# Patient Record
Sex: Male | Born: 1960 | Race: White | Hispanic: No
Health system: Southern US, Community
[De-identification: ages and names within clinical notes are randomized; demographics above are authoritative.]

---

## 2013-07-16 ENCOUNTER — Inpatient Hospital Stay (HOSPITAL_COMMUNITY): Payer: Medicaid Other

## 2013-07-16 ENCOUNTER — Inpatient Hospital Stay (HOSPITAL_COMMUNITY)
Admission: EM | Admit: 2013-07-16 | Discharge: 2013-07-29 | DRG: 296 | Disposition: E | Payer: Medicaid Other | Attending: Internal Medicine | Admitting: Internal Medicine

## 2013-07-16 ENCOUNTER — Emergency Department (HOSPITAL_COMMUNITY): Payer: Medicaid Other

## 2013-07-16 DIAGNOSIS — D649 Anemia, unspecified: Secondary | ICD-10-CM | POA: Diagnosis present

## 2013-07-16 DIAGNOSIS — R799 Abnormal finding of blood chemistry, unspecified: Secondary | ICD-10-CM

## 2013-07-16 DIAGNOSIS — Z681 Body mass index (BMI) 19 or less, adult: Secondary | ICD-10-CM

## 2013-07-16 DIAGNOSIS — J690 Pneumonitis due to inhalation of food and vomit: Secondary | ICD-10-CM | POA: Diagnosis not present

## 2013-07-16 DIAGNOSIS — E43 Unspecified severe protein-calorie malnutrition: Secondary | ICD-10-CM

## 2013-07-16 DIAGNOSIS — J4489 Other specified chronic obstructive pulmonary disease: Secondary | ICD-10-CM | POA: Diagnosis present

## 2013-07-16 DIAGNOSIS — R579 Shock, unspecified: Secondary | ICD-10-CM | POA: Diagnosis present

## 2013-07-16 DIAGNOSIS — I469 Cardiac arrest, cause unspecified: Secondary | ICD-10-CM

## 2013-07-16 DIAGNOSIS — R4182 Altered mental status, unspecified: Secondary | ICD-10-CM

## 2013-07-16 DIAGNOSIS — Z66 Do not resuscitate: Secondary | ICD-10-CM | POA: Diagnosis not present

## 2013-07-16 DIAGNOSIS — C159 Malignant neoplasm of esophagus, unspecified: Secondary | ICD-10-CM | POA: Diagnosis present

## 2013-07-16 DIAGNOSIS — R569 Unspecified convulsions: Secondary | ICD-10-CM | POA: Diagnosis not present

## 2013-07-16 DIAGNOSIS — J9600 Acute respiratory failure, unspecified whether with hypoxia or hypercapnia: Secondary | ICD-10-CM | POA: Diagnosis present

## 2013-07-16 DIAGNOSIS — R778 Other specified abnormalities of plasma proteins: Secondary | ICD-10-CM

## 2013-07-16 DIAGNOSIS — G931 Anoxic brain damage, not elsewhere classified: Secondary | ICD-10-CM | POA: Diagnosis present

## 2013-07-16 DIAGNOSIS — J9819 Other pulmonary collapse: Secondary | ICD-10-CM | POA: Diagnosis not present

## 2013-07-16 DIAGNOSIS — F17200 Nicotine dependence, unspecified, uncomplicated: Secondary | ICD-10-CM | POA: Diagnosis present

## 2013-07-16 DIAGNOSIS — E872 Acidosis, unspecified: Secondary | ICD-10-CM

## 2013-07-16 DIAGNOSIS — J449 Chronic obstructive pulmonary disease, unspecified: Secondary | ICD-10-CM | POA: Diagnosis present

## 2013-07-16 DIAGNOSIS — Z931 Gastrostomy status: Secondary | ICD-10-CM

## 2013-07-16 DIAGNOSIS — R7989 Other specified abnormal findings of blood chemistry: Secondary | ICD-10-CM

## 2013-07-16 DIAGNOSIS — Z515 Encounter for palliative care: Secondary | ICD-10-CM

## 2013-07-16 DIAGNOSIS — E876 Hypokalemia: Secondary | ICD-10-CM | POA: Diagnosis present

## 2013-07-16 LAB — POCT I-STAT, CHEM 8
BUN: 12 mg/dL (ref 6–23)
BUN: 14 mg/dL (ref 6–23)
BUN: 15 mg/dL (ref 6–23)
BUN: 17 mg/dL (ref 6–23)
BUN: 18 mg/dL (ref 6–23)
BUN: 18 mg/dL (ref 6–23)
CALCIUM ION: 1.26 mmol/L — AB (ref 1.12–1.23)
CHLORIDE: 105 meq/L (ref 96–112)
CHLORIDE: 106 meq/L (ref 96–112)
CHLORIDE: 107 meq/L (ref 96–112)
CHLORIDE: 113 meq/L — AB (ref 96–112)
CREATININE: 0.6 mg/dL (ref 0.50–1.35)
CREATININE: 0.6 mg/dL (ref 0.50–1.35)
Calcium, Ion: 1.2 mmol/L (ref 1.12–1.23)
Calcium, Ion: 1.23 mmol/L (ref 1.12–1.23)
Calcium, Ion: 1.26 mmol/L — ABNORMAL HIGH (ref 1.12–1.23)
Calcium, Ion: 1.26 mmol/L — ABNORMAL HIGH (ref 1.12–1.23)
Calcium, Ion: 1.26 mmol/L — ABNORMAL HIGH (ref 1.12–1.23)
Chloride: 105 mEq/L (ref 96–112)
Chloride: 105 mEq/L (ref 96–112)
Creatinine, Ser: 0.5 mg/dL (ref 0.50–1.35)
Creatinine, Ser: 0.6 mg/dL (ref 0.50–1.35)
Creatinine, Ser: 0.7 mg/dL (ref 0.50–1.35)
Creatinine, Ser: 0.7 mg/dL (ref 0.50–1.35)
GLUCOSE: 118 mg/dL — AB (ref 70–99)
Glucose, Bld: 270 mg/dL — ABNORMAL HIGH (ref 70–99)
Glucose, Bld: 278 mg/dL — ABNORMAL HIGH (ref 70–99)
Glucose, Bld: 259 mg/dL — ABNORMAL HIGH (ref 70–99)
Glucose, Bld: 212 mg/dL — ABNORMAL HIGH (ref 70–99)
Glucose, Bld: 126 mg/dL — ABNORMAL HIGH (ref 70–99)
HCT: 37 % — ABNORMAL LOW (ref 39.0–52.0)
HCT: 35 % — ABNORMAL LOW (ref 39.0–52.0)
HCT: 36 % — ABNORMAL LOW (ref 39.0–52.0)
HEMATOCRIT: 37 % — AB (ref 39.0–52.0)
HEMATOCRIT: 38 % — AB (ref 39.0–52.0)
HEMATOCRIT: 38 % — AB (ref 39.0–52.0)
HEMOGLOBIN: 12.6 g/dL — AB (ref 13.0–17.0)
HEMOGLOBIN: 12.9 g/dL — AB (ref 13.0–17.0)
Hemoglobin: 11.9 g/dL — ABNORMAL LOW (ref 13.0–17.0)
Hemoglobin: 12.2 g/dL — ABNORMAL LOW (ref 13.0–17.0)
Hemoglobin: 12.6 g/dL — ABNORMAL LOW (ref 13.0–17.0)
Hemoglobin: 12.9 g/dL — ABNORMAL LOW (ref 13.0–17.0)
POTASSIUM: 3.7 meq/L (ref 3.7–5.3)
POTASSIUM: 3.3 meq/L — AB (ref 3.7–5.3)
POTASSIUM: 3.6 meq/L — AB (ref 3.7–5.3)
Potassium: 2.9 mEq/L — CL (ref 3.7–5.3)
Potassium: 3 mEq/L — ABNORMAL LOW (ref 3.7–5.3)
Potassium: 3.5 mEq/L — ABNORMAL LOW (ref 3.7–5.3)
SODIUM: 144 meq/L (ref 137–147)
SODIUM: 143 meq/L (ref 137–147)
SODIUM: 144 meq/L (ref 137–147)
SODIUM: 141 meq/L (ref 137–147)
SODIUM: 148 meq/L — AB (ref 137–147)
Sodium: 147 mEq/L (ref 137–147)
TCO2: 24 mmol/L (ref 0–100)
TCO2: 24 mmol/L (ref 0–100)
TCO2: 25 mmol/L (ref 0–100)
TCO2: 27 mmol/L (ref 0–100)
TCO2: 27 mmol/L (ref 0–100)
TCO2: 27 mmol/L (ref 0–100)

## 2013-07-16 LAB — CBC
HEMATOCRIT: 33.9 % — AB (ref 39.0–52.0)
Hemoglobin: 11.2 g/dL — ABNORMAL LOW (ref 13.0–17.0)
MCH: 31.3 pg (ref 26.0–34.0)
MCHC: 33 g/dL (ref 30.0–36.0)
MCV: 94.7 fL (ref 78.0–100.0)
Platelets: 479 10*3/uL — ABNORMAL HIGH (ref 150–400)
RBC: 3.58 MIL/uL — ABNORMAL LOW (ref 4.22–5.81)
RDW: 14.2 % (ref 11.5–15.5)
WBC: 38.9 10*3/uL — ABNORMAL HIGH (ref 4.0–10.5)

## 2013-07-16 LAB — POCT I-STAT 3, ART BLOOD GAS (G3+)
Acid-Base Excess: 3 mmol/L — ABNORMAL HIGH (ref 0.0–2.0)
Bicarbonate: 26.3 mEq/L — ABNORMAL HIGH (ref 20.0–24.0)
O2 Saturation: 100 %
PH ART: 7.48 — AB (ref 7.350–7.450)
PO2 ART: 261 mmHg — AB (ref 80.0–100.0)
TCO2: 27 mmol/L (ref 0–100)
pCO2 arterial: 35.4 mmHg (ref 35.0–45.0)

## 2013-07-16 LAB — COMPREHENSIVE METABOLIC PANEL
ALBUMIN: 2.3 g/dL — AB (ref 3.5–5.2)
ALK PHOS: 102 U/L (ref 39–117)
ALT: 60 U/L — ABNORMAL HIGH (ref 0–53)
ALT: 57 U/L — AB (ref 0–53)
AST: 58 U/L — ABNORMAL HIGH (ref 0–37)
AST: 63 U/L — AB (ref 0–37)
Albumin: 2.1 g/dL — ABNORMAL LOW (ref 3.5–5.2)
Alkaline Phosphatase: 82 U/L (ref 39–117)
BILIRUBIN TOTAL: 0.2 mg/dL — AB (ref 0.3–1.2)
BILIRUBIN TOTAL: 0.3 mg/dL (ref 0.3–1.2)
BUN: 19 mg/dL (ref 6–23)
BUN: 19 mg/dL (ref 6–23)
CHLORIDE: 99 meq/L (ref 96–112)
CHLORIDE: 107 meq/L (ref 96–112)
CO2: 23 mEq/L (ref 19–32)
CO2: 25 mEq/L (ref 19–32)
CREATININE: 0.69 mg/dL (ref 0.50–1.35)
Calcium: 9.6 mg/dL (ref 8.4–10.5)
Calcium: 8.6 mg/dL (ref 8.4–10.5)
Creatinine, Ser: 0.8 mg/dL (ref 0.50–1.35)
GFR calc Af Amer: >90 mL/min (ref >90)
GFR calc non Af Amer: >90 mL/min (ref >90)
GLUCOSE: 298 mg/dL — AB (ref 70–99)
Glucose, Bld: 247 mg/dL — ABNORMAL HIGH (ref 70–99)
POTASSIUM: 3 meq/L — AB (ref 3.7–5.3)
Potassium: 2.9 mEq/L — CL (ref 3.7–5.3)
Sodium: 141 mEq/L (ref 137–147)
Sodium: 143 mEq/L (ref 137–147)
Total Protein: 7.1 g/dL (ref 6.0–8.3)
Total Protein: 6.2 g/dL (ref 6.0–8.3)

## 2013-07-16 LAB — URINE MICROSCOPIC-ADD ON

## 2013-07-16 LAB — CBC WITH DIFFERENTIAL/PLATELET
Basophils Absolute: 0 10*3/uL (ref 0.0–0.1)
Basophils Relative: 0 % (ref 0–1)
EOS PCT: 1 % (ref 0–5)
Eosinophils Absolute: 0.4 10*3/uL (ref 0.0–0.7)
HEMATOCRIT: 35.6 % — AB (ref 39.0–52.0)
Hemoglobin: 11.8 g/dL — ABNORMAL LOW (ref 13.0–17.0)
LYMPHS ABS: 2.5 10*3/uL (ref 0.7–4.0)
Lymphocytes Relative: 7 % — ABNORMAL LOW (ref 12–46)
MCH: 32.1 pg (ref 26.0–34.0)
MCHC: 33.1 g/dL (ref 30.0–36.0)
MCV: 96.7 fL (ref 78.0–100.0)
MONOS PCT: 11 % (ref 3–12)
Monocytes Absolute: 4 10*3/uL — ABNORMAL HIGH (ref 0.1–1.0)
NEUTROS ABS: 29.3 10*3/uL — AB (ref 1.7–7.7)
Neutrophils Relative %: 81 % — ABNORMAL HIGH (ref 43–77)
Platelets: 640 10*3/uL — ABNORMAL HIGH (ref 150–400)
RBC: 3.68 MIL/uL — AB (ref 4.22–5.81)
RDW: 14.3 % (ref 11.5–15.5)
WBC: 36.2 10*3/uL — ABNORMAL HIGH (ref 4.0–10.5)

## 2013-07-16 LAB — URINALYSIS, ROUTINE W REFLEX MICROSCOPIC
Bilirubin Urine: NEGATIVE
Glucose, UA: NEGATIVE mg/dL
KETONES UR: NEGATIVE mg/dL
LEUKOCYTES UA: NEGATIVE
NITRITE: NEGATIVE
PH: 6.5 (ref 5.0–8.0)
Protein, ur: 30 mg/dL — AB
Specific Gravity, Urine: 1.019 (ref 1.005–1.030)
UROBILINOGEN UA: 0.2 mg/dL (ref 0.0–1.0)

## 2013-07-16 LAB — LACTIC ACID, PLASMA
Lactic Acid, Venous: 2.6 mmol/L — ABNORMAL HIGH (ref 0.5–2.2)
Lactic Acid, Venous: 1.5 mmol/L (ref 0.5–2.2)

## 2013-07-16 LAB — PROTIME-INR
INR: 1.26 (ref 0.00–1.49)
INR: 1.34 (ref 0.00–1.49)
PROTHROMBIN TIME: 15.5 s — AB (ref 11.6–15.2)
Prothrombin Time: 16.3 seconds — ABNORMAL HIGH (ref 11.6–15.2)

## 2013-07-16 LAB — BLOOD GAS, ARTERIAL
Acid-base deficit: 0 mmol/L (ref 0.0–2.0)
Bicarbonate: 24.9 mEq/L — ABNORMAL HIGH (ref 20.0–24.0)
Drawn by: 331761
FIO2: 0.4 %
LHR: 14 {breaths}/min
O2 Saturation: 98.1 %
PEEP/CPAP: 5 cmH2O
PO2 ART: 91.3 mmHg (ref 80.0–100.0)
Patient temperature: 91.4
TCO2: 26.4 mmol/L (ref 0–100)
VT: 500 mL
pCO2 arterial: 38.6 mmHg (ref 35.0–45.0)
pH, Arterial: 7.403 (ref 7.350–7.450)

## 2013-07-16 LAB — MRSA PCR SCREENING: MRSA BY PCR: NEGATIVE

## 2013-07-16 LAB — APTT
aPTT: 33 seconds (ref 24–37)
aPTT: 33 seconds (ref 24–37)

## 2013-07-16 LAB — GLUCOSE, CAPILLARY
GLUCOSE-CAPILLARY: 258 mg/dL — AB (ref 70–99)
GLUCOSE-CAPILLARY: 242 mg/dL — AB (ref 70–99)
GLUCOSE-CAPILLARY: 196 mg/dL — AB (ref 70–99)
Glucose-Capillary: 266 mg/dL — ABNORMAL HIGH (ref 70–99)
Glucose-Capillary: 214 mg/dL — ABNORMAL HIGH (ref 70–99)
Glucose-Capillary: 177 mg/dL — ABNORMAL HIGH (ref 70–99)

## 2013-07-16 LAB — STREP PNEUMONIAE URINARY ANTIGEN: STREP PNEUMO URINARY ANTIGEN: NEGATIVE

## 2013-07-16 LAB — TROPONIN I
TROPONIN I: 2.42 ng/mL — AB (ref <0.30)
Troponin I: 0.53 ng/mL (ref <0.30)
Troponin I: 2.6 ng/mL (ref <0.30)

## 2013-07-16 LAB — PROCALCITONIN: Procalcitonin: <0.1 ng/mL

## 2013-07-16 LAB — CORTISOL: Cortisol, Plasma: 34.1 ug/dL

## 2013-07-16 LAB — CG4 I-STAT (LACTIC ACID): LACTIC ACID, VENOUS: 7.36 mmol/L — AB (ref 0.5–2.2)

## 2013-07-16 LAB — MAGNESIUM: Magnesium: 2.3 mg/dL (ref 1.5–2.5)

## 2013-07-16 LAB — PHOSPHORUS: Phosphorus: 3 mg/dL (ref 2.3–4.6)

## 2013-07-16 MED ORDER — BIOTENE DRY MOUTH MT LIQD
15.0000 mL | Freq: Four times a day (QID) | OROMUCOSAL | Status: DC
  Administered 2013-07-17 – 2013-07-19 (×10): 15 mL via OROMUCOSAL

## 2013-07-16 MED ORDER — SODIUM CHLORIDE 0.9 % IV SOLN
INTRAVENOUS | Status: DC
  Administered 2013-07-16: 2 [IU]/h via INTRAVENOUS
  Filled 2013-07-16 (×2): qty 1

## 2013-07-16 MED ORDER — ASPIRIN 300 MG RE SUPP
300.0000 mg | RECTAL | Status: DC
  Administered 2013-07-16: 300 mg via RECTAL

## 2013-07-16 MED ORDER — FENTANYL CITRATE 0.05 MG/ML IJ SOLN: 100.0000 ug | INTRAMUSCULAR | Status: DC | PRN

## 2013-07-16 MED ORDER — MIDAZOLAM HCL 2 MG/2ML IJ SOLN: 2.0000 mg | Freq: Once | INTRAMUSCULAR | Status: DC

## 2013-07-16 MED ORDER — FENTANYL CITRATE 0.05 MG/ML IJ SOLN: 100.0000 ug | Freq: Once | INTRAMUSCULAR | Status: AC | PRN

## 2013-07-16 MED ORDER — CHLORHEXIDINE GLUCONATE 0.12 % MT SOLN
15.0000 mL | Freq: Two times a day (BID) | OROMUCOSAL | Status: DC
  Administered 2013-07-16 – 2013-07-19 (×6): 15 mL via OROMUCOSAL
  Filled 2013-07-16 (×6): qty 15

## 2013-07-16 MED ORDER — CISATRACURIUM BOLUS VIA INFUSION
0.1000 mg/kg | Freq: Once | INTRAVENOUS | Status: DC
  Filled 2013-07-16: qty 6

## 2013-07-16 MED ORDER — SODIUM CHLORIDE 0.9 % IV SOLN
25.0000 ug/h | INTRAVENOUS | Status: DC
  Administered 2013-07-16: 100 ug/h via INTRAVENOUS
  Filled 2013-07-16: qty 50

## 2013-07-16 MED ORDER — PANTOPRAZOLE SODIUM 40 MG IV SOLR
40.0000 mg | Freq: Every day | INTRAVENOUS | Status: DC
  Administered 2013-07-16 – 2013-07-18 (×3): 40 mg via INTRAVENOUS
  Filled 2013-07-16 (×5): qty 40

## 2013-07-16 MED ORDER — FENTANYL CITRATE 0.05 MG/ML IJ SOLN
100.0000 ug | Freq: Once | INTRAMUSCULAR | Status: AC
  Administered 2013-07-16: 100 ug via INTRAVENOUS

## 2013-07-16 MED ORDER — PIPERACILLIN-TAZOBACTAM 3.375 G IVPB
3.3750 g | Freq: Three times a day (TID) | INTRAVENOUS | Status: DC
  Filled 2013-07-16 (×2): qty 50

## 2013-07-16 MED ORDER — FENTANYL BOLUS VIA INFUSION
50.0000 ug | INTRAVENOUS | Status: DC | PRN
  Filled 2013-07-16: qty 50

## 2013-07-16 MED ORDER — MIDAZOLAM HCL 2 MG/2ML IJ SOLN: 2.0000 mg | Freq: Once | INTRAMUSCULAR | Status: AC | PRN

## 2013-07-16 MED ORDER — PIPERACILLIN-TAZOBACTAM 3.375 G IVPB 30 MIN: 3.3750 g | Freq: Once | INTRAVENOUS | Status: DC

## 2013-07-16 MED ORDER — VANCOMYCIN HCL IN DEXTROSE 750-5 MG/150ML-% IV SOLN
750.0000 mg | Freq: Two times a day (BID) | INTRAVENOUS | Status: DC
  Administered 2013-07-16 – 2013-07-19 (×6): 750 mg via INTRAVENOUS
  Filled 2013-07-16 (×8): qty 150

## 2013-07-16 MED ORDER — POTASSIUM CHLORIDE 10 MEQ/100ML IV SOLN
10.0000 meq | INTRAVENOUS | Status: AC
  Administered 2013-07-16 (×4): 10 meq via INTRAVENOUS
  Filled 2013-07-16 (×4): qty 100

## 2013-07-16 MED ORDER — SODIUM CHLORIDE 0.9 % IV SOLN
2000.0000 mL | Freq: Once | INTRAVENOUS | Status: AC
  Administered 2013-07-16: 2000 mL via INTRAVENOUS

## 2013-07-16 MED ORDER — SODIUM CHLORIDE 0.9 % IV SOLN
1.0000 ug/kg/min | INTRAVENOUS | Status: DC
  Administered 2013-07-16: 1 ug/kg/min via INTRAVENOUS
  Filled 2013-07-16: qty 20

## 2013-07-16 MED ORDER — PIPERACILLIN-TAZOBACTAM 3.375 G IVPB
3.3750 g | Freq: Three times a day (TID) | INTRAVENOUS | Status: DC
  Administered 2013-07-16 – 2013-07-19 (×9): 3.375 g via INTRAVENOUS
  Filled 2013-07-16 (×12): qty 50

## 2013-07-16 MED ORDER — SODIUM CHLORIDE 0.9 % IV BOLUS (SEPSIS)
1000.0000 mL | Freq: Once | INTRAVENOUS | Status: AC
  Administered 2013-07-16: 1000 mL via INTRAVENOUS

## 2013-07-16 MED ORDER — FENTANYL BOLUS VIA INFUSION
50.0000 ug | INTRAVENOUS | Status: DC | PRN
Start: 2013-07-16 — End: 2013-07-16
  Filled 2013-07-16: qty 50

## 2013-07-16 MED ORDER — DEXTROSE 10 % IV SOLN
INTRAVENOUS | Status: DC | PRN
Start: 2013-07-16 — End: 2013-07-16

## 2013-07-16 MED ORDER — ASPIRIN 300 MG RE SUPP
300.0000 mg | RECTAL | Status: AC
  Administered 2013-07-16: 300 mg via RECTAL
  Filled 2013-07-16: qty 1

## 2013-07-16 MED ORDER — MIDAZOLAM BOLUS VIA INFUSION
2.0000 mg | INTRAVENOUS | Status: DC | PRN
  Filled 2013-07-16: qty 2

## 2013-07-16 MED ORDER — SODIUM CHLORIDE 0.9 % IV SOLN
1.0000 mg/h | INTRAVENOUS | Status: DC
  Administered 2013-07-16 (×2): 2 mg/h via INTRAVENOUS
  Filled 2013-07-16 (×4): qty 10

## 2013-07-16 MED ORDER — DOPAMINE-DEXTROSE 3.2-5 MG/ML-% IV SOLN
INTRAVENOUS | Status: AC
  Administered 2013-07-16: 800 mg
  Filled 2013-07-16: qty 250

## 2013-07-16 MED ORDER — FENTANYL CITRATE 0.05 MG/ML IJ SOLN
INTRAMUSCULAR | Status: AC
  Filled 2013-07-16: qty 2

## 2013-07-16 MED ORDER — SODIUM CHLORIDE 0.9 % IV SOLN
25.0000 ug/h | INTRAVENOUS | Status: DC
  Filled 2013-07-16 (×3): qty 50

## 2013-07-16 MED ORDER — IPRATROPIUM-ALBUTEROL 0.5-2.5 (3) MG/3ML IN SOLN
3.0000 mL | RESPIRATORY_TRACT | Status: DC
  Administered 2013-07-16 – 2013-07-19 (×18): 3 mL via RESPIRATORY_TRACT
  Filled 2013-07-16 (×18): qty 3

## 2013-07-16 MED ORDER — SODIUM CHLORIDE 0.9 % IV SOLN: 2000.0000 mL | Freq: Once | INTRAVENOUS | Status: DC

## 2013-07-16 MED ORDER — DEXTROSE 5 % IV SOLN
0.5000 ug/min | INTRAVENOUS | Status: DC
  Administered 2013-07-16: 5.493 ug/min via INTRAVENOUS
  Administered 2013-07-17: 30 ug/min via INTRAVENOUS
  Administered 2013-07-17: 20 ug/min via INTRAVENOUS
  Administered 2013-07-17: 10 ug/min via INTRAVENOUS
  Administered 2013-07-17: 20 ug/min via INTRAVENOUS
  Administered 2013-07-18 (×2): 30 ug/min via INTRAVENOUS
  Administered 2013-07-18: 20 ug/min via INTRAVENOUS
  Filled 2013-07-16 (×8): qty 4

## 2013-07-16 MED ORDER — FENTANYL CITRATE 0.05 MG/ML IJ SOLN: 100.0000 ug | Freq: Once | INTRAMUSCULAR | Status: DC

## 2013-07-16 MED ORDER — ENOXAPARIN SODIUM 40 MG/0.4ML ~~LOC~~ SOLN
40.0000 mg | SUBCUTANEOUS | Status: DC
  Administered 2013-07-16 – 2013-07-19 (×4): 40 mg via SUBCUTANEOUS
  Filled 2013-07-16 (×5): qty 0.4

## 2013-07-16 MED ORDER — CISATRACURIUM BOLUS VIA INFUSION
0.0500 mg/kg | INTRAVENOUS | Status: DC | PRN
  Filled 2013-07-16: qty 3

## 2013-07-16 MED ORDER — NOREPINEPHRINE BITARTRATE 1 MG/ML IJ SOLN
0.5000 ug/min | INTRAVENOUS | Status: DC
  Administered 2013-07-16: 10 ug/min via INTRAVENOUS
  Filled 2013-07-16: qty 4

## 2013-07-16 MED ORDER — CISATRACURIUM BESYLATE 10 MG/ML IV SOLN
1.0000 ug/kg/min | INTRAVENOUS | Status: DC
  Filled 2013-07-16: qty 20

## 2013-07-16 MED ORDER — DEXTROSE 10 % IV SOLN: INTRAVENOUS | Status: DC | PRN

## 2013-07-16 MED ORDER — INSULIN GLARGINE 100 UNIT/ML ~~LOC~~ SOLN
10.0000 [IU] | Freq: Every day | SUBCUTANEOUS | Status: DC
  Administered 2013-07-16 – 2013-07-18 (×3): 10 [IU] via SUBCUTANEOUS
  Filled 2013-07-16 (×4): qty 0.1

## 2013-07-16 MED ORDER — PROPOFOL 10 MG/ML IV EMUL
5.0000 ug/kg/min | INTRAVENOUS | Status: DC
Start: 2013-07-16 — End: 2013-07-16
  Administered 2013-07-16: 10 ug/kg/min via INTRAVENOUS
  Filled 2013-07-16: qty 100

## 2013-07-16 MED ORDER — POTASSIUM CHLORIDE 10 MEQ/50ML IV SOLN
10.0000 meq | INTRAVENOUS | Status: AC
  Administered 2013-07-16 (×2): 10 meq via INTRAVENOUS
  Filled 2013-07-16 (×2): qty 50

## 2013-07-16 MED ORDER — VANCOMYCIN HCL IN DEXTROSE 750-5 MG/150ML-% IV SOLN
750.0000 mg | Freq: Two times a day (BID) | INTRAVENOUS | Status: DC
  Filled 2013-07-16 (×3): qty 150

## 2013-07-16 MED ORDER — INSULIN ASPART 100 UNIT/ML ~~LOC~~ SOLN: 2.0000 [IU] | SUBCUTANEOUS | Status: DC

## 2013-07-16 MED ORDER — IOHEXOL 300 MG/ML  SOLN
75.0000 mL | Freq: Once | INTRAMUSCULAR | Status: AC | PRN
  Administered 2013-07-16: 75 mL via INTRAVENOUS

## 2013-07-16 NOTE — Progress Notes (Addendum)
Pt with unknown nutrition related history admitted s/p PEA.  Sister reported poor PO at least in the past 3 days.  Pt meets criteria for severe malnutrition based on physical exam showing severe muscle and subcutaneous fat wasting.  Pt with h/o esophageal cancer and G-tube in place. Unsure whether pt was using tube at home.  Recent hospitalization for further cancer work-up. Will page MD for refeeding labs if TFs are initiated for pt after re-warming process.  RD notes pt with poor prognosis.   Brynda Greathouse, MS RD LDN Clinical Inpatient Dietitian Pager: [phone] Weekend/After hours pager: [phone]

## 2013-07-16 NOTE — ED Notes (Signed)
Pt arrived from home via [place], c/o post CPR.

## 2013-07-16 NOTE — Progress Notes (Signed)
INITIAL NUTRITION ASSESSMENT  DOCUMENTATION CODES Per approved criteria  -Severe malnutrition in the context of chronic illness   INTERVENTION:  If TF initiated, recommend Vital AF 1.2 at 20 ml/hr increasing by 10 ml/hr every 12 hours to reach goal rate of 50 ml/hr.  Above TF regimen provides 1440 kcal (99% of estimated needs), 90 grams protein, and 973 ml free water.  Patient at risk for refeeding syndrome due to severe malnutrition. Recommend monitoring magnesium, potassium, and phosphorous for 3 days once feeds are started.  NUTRITION DIAGNOSIS: Inadequate oral intake related to inability to eat as evidenced by NPO status.   Goal: Patient to meet >/=90% of estimated nutrition needs  Monitor:  Vent status, TF initiation, weight trends, lab trends  Reason for Assessment: New Ventilator  52 y.o. male  Admitting Dx: PEA arrest  ASSESSMENT: 53 yo WM with little known PMH , presents with post arrests, down time 15 minutes, CPR x 5 minutes , 1 epi with return of pulse. Transported via [facility] to [facility] ED and king airway changed to OTT. 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  Per EMS RN and Dr Roxanne Mins info obtained later who got infrom sister in law: Discharged from [place] on 07/13/13 following throat cancer workup. Post dc not doing well and not eating and having dyspnea. Family found him in bathroom. Asystole on EMS arrival. And epi x 1 with ROSC. Then enroute PEA with epi x 2 and arrived on dopamine.   Patient is currently intubated on ventilator support. MV:  7.3 L/min Temp (24hrs), Avg:92.3 F (33.5 C), Min:90.7 F (32.6 C), Max:95.5 F (35.3 C)   Nutrition Focused Physical Exam:  Subcutaneous Fat:  Orbital Region: moderate depletion Upper Arm Region: severe depletion Thoracic and Lumbar Region: moderate depletion  Muscle:  Temple Region: moderate depletion Clavicle Bone Region: moderate depletion Clavicle and Acromion Bone Region: moderate  depletion Scapular Bone Region: moderate depletion Dorsal Hand: moderate depletion Patellar Region: severe depletion Anterior Thigh Region: N/A Posterior Calf Region: severe depletion  Edema: none noted   Patient meets criteria for severe malnutrition in the context of chronic illness as evidenced by severe body fat and muscle mass depletion.  Height: Ht Readings from Last 1 Encounters:  08-14-13 5\' 6"  (1.676 m)    Weight: Wt Readings from Last 1 Encounters:  08/14/2013 114 lb 13.8 oz (52.1 kg)    Ideal Body Weight: 142 lb (64.5 kg)  % Ideal Body Weight: 80%  Wt Readings from Last 10 Encounters:  Aug 14, 2013 114 lb 13.8 oz (52.1 kg)    Usual Body Weight: unkown  % Usual Body Weight: unable to assess  BMI:  Body mass index is 18.55 kg/(m^2).  Estimated Nutritional Needs: Kcal: 1448 Protein: 80-90 grams Fluid: >1.5 L  Skin: no wounds  Diet Order:  NPO  EDUCATION NEEDS: -Education not appropriate at this time   Intake/Output Summary (Last 24 hours) at 08-14-13 1249 Last data filed at August 14, 2013 1200  Gross per 24 hour  Intake 1965.98 ml  Output    845 ml  Net 1120.98 ml    Last BM: PTA  Labs:   Recent Labs Lab August 14, 2013 0600 08/14/2013 0741 08/14/13 0859 08/14/2013 1105  NA 141 143 144 143  K 3.0* 2.9* 2.9* 3.7  CL 99 107 105 105  CO2 23 25  --   --   BUN 19 19 18 18   CREATININE 0.80 0.69 0.70 0.70  CALCIUM 9.6 8.6  --   --  MG  --  2.3  --   --   PHOS  --  3.0  --   --   GLUCOSE 298* 247* 270* 278*    CBG (last 3)  No results found for this basename: GLUCAP,  in the last 72 hours  Scheduled Meds: . sodium chloride  2,000 mL Intravenous Once  . cisatracurium  0.1 mg/kg Intravenous Once  . enoxaparin (LOVENOX) injection  40 mg Subcutaneous Q24H  . fentaNYL      . fentaNYL  100 mcg Intravenous Once  . insulin aspart  2-6 Units Subcutaneous 6 times per day  . ipratropium-albuterol  3 mL Nebulization Q4H  . midazolam  2 mg Intravenous Once  .  pantoprazole (PROTONIX) IV  40 mg Intravenous QHS  . piperacillin-tazobactam (ZOSYN)  IV  3.375 g Intravenous Q8H  . vancomycin  750 mg Intravenous Q12H    Continuous Infusions: . cisatracurium (NIMBEX) infusion 1.5 mcg/kg/min (07-21-2013 1200)  . fentaNYL infusion INTRAVENOUS 150 mcg/hr (Jul 21, 2013 1200)  . insulin (NOVOLIN-R) infusion    . midazolam (VERSED) infusion 3 mg/hr (July 21, 2013 1200)  . norepinephrine (LEVOPHED) Adult infusion 10 mcg/min (July 21, 2013 1200)    No past medical history on file.  No past surgical history on file.  Claudell Kyle, Dietetic Intern Pager: [phone]

## 2013-07-16 NOTE — ED Notes (Signed)
EMS reports initial rhythm on scene as asystole

## 2013-07-16 NOTE — Procedures (Signed)
Central Venous Catheter Insertion Procedure Note Brandon Aguirre [id number] 1961-03-29  Procedure: Insertion of Central Venous Catheter Indications: Assessment of intravascular volume  Procedure Details Consent: Risks of procedure as well as the alternatives and risks of each were explained to the (patient/caregiver).  Consent for procedure obtained. and Unable to obtain consent because of altered level of consciousness. Time Out: Verified patient identification, verified procedure, site/side was marked, verified correct patient position, special equipment/implants available, medications/allergies/relevent history reviewed, required imaging and test results available.  Performed  Maximum sterile technique was used including antiseptics, cap, gloves, gown, hand hygiene, mask and sheet. Skin prep: Chlorhexidine; local anesthetic administered A antimicrobial bonded/coated triple lumen catheter was placed in the left internal jugular vein using the Seldinger technique. Ultrasound guidance used.yes Catheter placed to 20 cm. Blood aspirated via all 3 ports and then flushed x 3. Line sutured x 2 and dressing applied.  Evaluation Blood flow good Complications: No apparent complications Patient did tolerate procedure well. Chest X-ray ordered to verify placement.  CXR: normal.  Brandon Aguirre Brandon Aguirre ACNP Brandon Aguirre PCCM Pager [phone] till 3 pm If no answer page [phone] 08-10-2013, 8:43 AM

## 2013-07-16 NOTE — Progress Notes (Signed)
Utilization Review Completed.Brandon Aguirre T2/16/2015  

## 2013-07-16 NOTE — Procedures (Signed)
ELECTROENCEPHALOGRAM REPORT   Patient: Brandon Aguirre       Room #: 6V78 EEG No. ID: [id number] Age: 53 y.o.        Sex: male Referring Physician: Chase Caller Report Date:  Jul 31, 2013        Interpreting Physician: Alexis Goodell D  History: Redmond Whittley is an 53 y.o. male unresponsive s/p arrest  Medications:  Scheduled: . sodium chloride  2,000 mL Intravenous Once  . cisatracurium  0.1 mg/kg Intravenous Once  . enoxaparin (LOVENOX) injection  40 mg Subcutaneous Q24H  . fentaNYL      . fentaNYL  100 mcg Intravenous Once  . insulin aspart  2-6 Units Subcutaneous 6 times per day  . ipratropium-albuterol  3 mL Nebulization Q4H  . midazolam  2 mg Intravenous Once  . pantoprazole (PROTONIX) IV  40 mg Intravenous QHS  . piperacillin-tazobactam (ZOSYN)  IV  3.375 g Intravenous Q8H  . vancomycin  750 mg Intravenous Q12H    Conditions of Recording:  This is a 16 channel EEG carried out with the patient in the intubated and sedated state on hypothermic protocol.  Description:  The background activity is attenuated for the majority of the tracing with no cerebral activity appreciated despite decreasing the sensitivity to 5uV/mm.  Infrequently there was noted bursts of high voltage polyspike, spike and slow wave activity lasting up to 5 seconds per burst.  These were in clusters when they did occur with intervening attenuated activity.  During two of these clusters some facial twitching was noted but not for the extent of the cluster.    Hyperventilation and intermittent photic stimulation were not performed.  IMPRESSION: This is a markedly abnormal electroencephalogram secondary to the markedly attenuated background activity and bursts of polyspike, spike and slow wave activity.  This can be seen during hypothermia and at this time there is no suggestion of nonconvulsive status epilepticus.     Alexis Goodell, MD [facility] [phone] 07/31/2013, 1:14 PM

## 2013-07-16 NOTE — ED Notes (Signed)
I stat lactic acid results given to Dr. Roxanne Mins by B. Yolanda Bonine, EMT

## 2013-07-16 NOTE — Progress Notes (Addendum)
Both phone numbers in chart have either been changed or disconnected. CSW attempting to find additional contact information. Phone numbers for Brandon Aguirre (sister-in-law): home [phone]) cell [phone]). Pt states home number is best number to reach her.   Ky Barban, MSW, [facility] Clinical Social Worker [phone]

## 2013-07-16 NOTE — Procedures (Addendum)
Supervised procedure.  Real time 2D ultrasound used for vein site selection, patency assessment, and needle entry.  A record of image was made but could not be submitted for filing due to malfunction of printing device   Dr. Brand Males, M.D., [facility].C.P Pulmonary and Critical Care Medicine Staff Physician [place] Pager: [phone], If no answer or between  15:00h - 7:00h: call [phone]  07/18/2013 9:43 AM

## 2013-07-16 NOTE — Progress Notes (Addendum)
Notified Dr. Melvyn Novas of k level.

## 2013-07-16 NOTE — Progress Notes (Signed)
Name: Brandon Aguirre MRN: [id number] DOB: 26-Aug-1960  ELECTRONIC ICU PHYSICIAN NOTE  Problem:  hypokalemia  Intervention:  KCl x 4 runs and check abg's for alkalosis  Christinia Gully 07/11/2013, 7:06 PM

## 2013-07-16 NOTE — H&P (Addendum)
Name: Brandon Aguirre MRN: [id number] DOB: 1960/07/03    ADMISSION DATE:  07/06/2013   REFERRING MD :  EDP PRIMARY SERVICE:PCCM  CHIEF COMPLAINT: PEA arrest  BRIEF PATIENT DESCRIPTION:   53 yo WM with little known PMH , presents with post arrests, down time 15 minutes, CPR x 5 minutes , 1 epi with return of pulse. Transported via [facility] to [facility] ED and king airway changed to OTT. 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 No other information or family available.  STaff MD note: Per EMS RN and Dr Roxanne Mins info obtained later who got infrom sister in law: Discharged from [place] on 07/13/13 following throat cancer workup. Post dc not doing well and not eating and having dyspnea. Family found him in bathroom. Asystole on EMS arrival. And epi x 1 with ROSC. Then enroute PEA with epi x 2 and arrived on dopamine  SIGNIFICANT EVENTS / STUDIES:  2/16 hypothermia>>  LINES / TUBES: 2/16 ott>> 2/16 Lt I J cvl>>  CULTURES: 2/16 bc x 2>> 2/16 uc>> 2/16 sputum>>  ANTIBIOTICS: 2/16 vanc>> 2/16 zoysn>>  HISTORY OF PRESENT ILLNESS:    53 yo WM with little known PMH , presents with post arrests, down time 15 minutes, CPR x 5 minutes , 1 epi with return of pulse. Transported via [facility] to [facility] ED and king airway changed to OTT. 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 No other information or family available.  PAST MEDICAL HISTORY :  No past medical history on file. No past surgical history on file. Prior to Admission medications   Not on File   Allergies not on file  FAMILY HISTORY:  No family history on file. SOCIAL HISTORY:  has no tobacco, alcohol, and drug history on file.  REVIEW OF SYSTEMS: Na  SUBJECTIVE:   VITAL SIGNS: Temp:  [95.2 F (35.1 C)-95.5 F (35.3 C)] 95.2 F (35.1 C) (02/16 0630) Pulse Rate:  [83-125] 125 (02/16 0640) Resp:  [11-25] 17 (02/16 0640) BP: (77-129)/(44-60) 112/60 mmHg (02/16 0640) SpO2:  [99 %-100 %] 100 %  (02/16 0640) FiO2 (%):  [100 %] 100 % (02/16 0649) Weight:  [115 lb (52.164 kg)] 115 lb (52.164 kg) (02/16 0601) HEMODYNAMICS:   VENTILATOR SETTINGS: Vent Mode:  [-] PRVC FiO2 (%):  [100 %] 100 % Set Rate:  [14 bmp] 14 bmp Vt Set:  [400 mL-500 mL] 500 mL PEEP:  [5 cmH20] 5 cmH20 Plateau Pressure:  [11 cmH20] 11 cmH20 INTAKE / OUTPUT: Intake/Output   None     PHYSICAL EXAMINATION: General:  Thin wm on vent with recent nmb Neuro: sedated on vent, ?? Seizure movement on eyelids and face HEENT: no jvd/lan Cardiovascular:  HSR RRR Lungs:  CTA, air hungry or current vent settings Abdomen:  Flat, G tube in place Musculoskeletal:  Intact Skin:  warm  LABS: PULMONARY  Recent Labs Lab 07/15/2013 0641  PHART 7.480*  PCO2ART 35.4  PO2ART 261.0*  HCO3 26.3*  TCO2 27  O2SAT 100.0    CBC  Recent Labs Lab 07/25/2013 0600  HGB 11.8*  HCT 35.6*  WBC 36.2*  PLT 640*    COAGULATION  Recent Labs Lab 07/18/2013 0600  INR 1.26    CARDIAC   Recent Labs Lab 07/11/2013 0600  TROPONINI 0.53*   No results found for this basename: PROBNP,  in the last 168 hours   CHEMISTRY  Recent Labs Lab 07/18/2013 0600  NA 141  K 3.0*  CL 99  CO2 23  GLUCOSE 298*  BUN 19  CREATININE 0.80  CALCIUM 9.6   Estimated Creatinine Clearance: 77.9 ml/min (by C-G formula based on Cr of 0.8).   LIVER  Recent Labs Lab 07/22/2013 0600  AST 58*  ALT 60*  ALKPHOS 102  BILITOT 0.2*  PROT 7.1  ALBUMIN 2.3*  INR 1.26     INFECTIOUS  Recent Labs Lab 07/12/2013 0604  LATICACIDVEN 7.36*     ENDOCRINE CBG (last 3)  No results found for this basename: GLUCAP,  in the last 72 hours       IMAGING x48h  Dg Chest Portable 1 View  07/25/2013   CLINICAL DATA:  Central line placement  EXAM: PORTABLE CHEST - 1 VIEW  COMPARISON:  07/06/2013 at 6:04 a.m.  FINDINGS: Endotracheal tube ends in the mid thoracic trachea. New left IJ central venous catheter, tip at the level of the mid  SVC.  Normal heart size and mediastinal contours. Hyperinflated lungs without new opacification. The retrocardiac lung is not well evaluated due to overlapping external defibrillator pads. No effusion or pneumothorax.  IMPRESSION: New left IJ catheter in good position.  No pneumothorax.   Electronically Signed   By: Jorje Guild M.D.   On: 07/23/2013 07:02   Dg Chest Portable 1 View  07/12/2013   CLINICAL DATA:  Check ETT position.  EXAM: PORTABLE CHEST - 1 VIEW  COMPARISON:  06/29/2013  FINDINGS: Endotracheal tube is 4 cm above the carina. Heart is normal size. Patchy airspace disease in the left lung base. Right lung is clear. No effusions or acute bony abnormality.  IMPRESSION: Endotracheal tube 4 cm above the carina.  Patchy left lower lobe atelectasis or infiltrate.   Electronically Signed   By: Rolm Baptise M.D.   On: 07/13/2013 06:35        ASSESSMENT / PLAN:  PULMONARY A: VDRF secondary to Arrest.  ? Hx of COPD P:   Vent Bundle duopneb  CARDIOVASCULAR A: Asytole P:  Hypothermia protocol Serial troponin Get ECHO Hold off cards consult for now  RENAL A:  Hypokalemia P:   Replete and follow labs Track mag and phos  GASTROINTESTINAL A:  G tube in place. Hx  Of Esophogeal cancer  Per scantry reports. 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 seizure P:   Check ct head EEG  ONCOLOGY A: Esophageal cancer. CT neck 06/29/13 with cancer in throat  P CT neck Get oncology notes and prognosis   GLOBAL A: Poor prognosis. No family at bedside since patient arrival to ER . Per Dr Roxanne Mins of ER who  spoke to sister in law: patient is smoker, etoh and returned from [facility] in cab. Therefore, suspect poor social support  P Social work consult Spiritual care consult  [name] Minor ACNP Maryanna Shape PCCM Pager [phone] till 3 pm If no answer page [phone] 07/28/2013, 6:57 AM    STAFF MD NOTE:   53 yo WM with little known PMH , presents with post arrests, down time 15 minutes,  CPR x 5 minutes , 1 epi with return of pulse. Transported via [facility] to [facility] ED and king airway changed to OTT. 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 No other information or family available. In ER: seein with possible seizure on face and eyelids by staff MD. Will try to get EEG. Also, access [facility] records    STAFF NOTE: I, Dr Ann Lions have personally reviewed patient's available data, including medical history, events of note, physical examination and test results as part of my evaluation. I have discussed with resident/NP and other care providers such as pharmacist, RN and RRT.  In addition,  I personally evaluated patient and elicited key findings of  See above staff note  Rest per NP/medical resident whose note is outlined above and that I agree with  The patient is critically ill with multiple organ systems failure and requires high complexity decision making for assessment and support, frequent evaluation and titration of therapies, application of advanced monitoring technologies and extensive interpretation of multiple databases.   Critical Care Time devoted to patient care services described in this note is  45  Minutes.  Dr. Brand Males, M.D., [facility].C.P Pulmonary and Critical Care Medicine Staff Physician [place] [facility] Critical [facility] Pager: [phone], If no answer or between  15:00h - 7:00h: call [phone]  07/28/2013 7:30 AM

## 2013-07-16 NOTE — Progress Notes (Signed)
ANTIBIOTIC CONSULT NOTE - INITIAL  Pharmacy Consult for Vancocin and Zosyn Indication: rule out sepsis  Patient Measurements: Height: 5\' 6"  (167.6 cm) Weight: 115 lb (52.164 kg) IBW/kg (Calculated) : 63.8  Vital Signs: Temp: 95.2 F (35.1 C) (02/16 0630) BP: 112/60 mmHg (02/16 0640) Pulse Rate: 125 (02/16 0640)  Labs:  Recent Labs  July 25, 2013 0600  WBC 36.2*  HGB 11.8*  PLT 640*  CREATININE 0.80   Estimated Creatinine Clearance: 77.9 ml/min (by C-G formula based on Cr of 0.8).   Microbiology: No results found for this or any previous visit (from the past 720 hour(s)).  Medical History: No past medical history on file.   Assessment: 53yo male presents via [facility] s/p CPR, initial rhythm per EMS was asystole, ROSC s/p CPR x8min w/ epi x2 and dopamine gtt, started on hypothermia, concern for PNA/sepsis w/ atelectasis vs infiltrate on CXR, to begin IV ABX.  Goal of Therapy:  Vancomycin trough level 15-20 mcg/ml  Plan:  Will begin vancomycin 750mg  IV Q12H and Zosyn 3.375g IV Q8H and monitor CBC, Cx, levels prn.  Wynona Neat, PharmD, BCPS  07-25-13,7:20 AM

## 2013-07-16 NOTE — ED Notes (Addendum)
EMS started Dopamine drip at 5 mcg. 400 mg per 250 ML

## 2013-07-16 NOTE — ED Provider Notes (Signed)
CSN: [id number]     Arrival date & time 07/17/2013  0547 History   First MD Initiated Contact with Patient 07/11/2013 [phone]     Chief Complaint  Patient presents with  . Post CPR      (Consider location/radiation/quality/duration/timing/severity/associated sxs/prior Treatment) The history is provided by the EMS personnel. The history is limited by the condition of the patient (Unresponsive).   53 year old male was brought to the ED status post cardiac arrest with successful resuscitation. Family is not available at this time. EMS reports that they were called to the home because of difficulty breathing and on arrival, patient was noted to be apneic and pulseless with CPR having been initiated. Initial rhythm was asystole but there is return of spontaneous circulation with a single dose of epinephrine. In route to the hospital, they placed lost pulses so at which point the rhythm was pulseless electrical activity which also responded only to occasional single dose of epinephrine. He was started on dopamine for hypotension. There is a report that he is being evaluated for a cancer of the neck and also some problem with his sinuses. EMS did place a [facility] airway.  No past medical history on file. No past surgical history on file. No family history on file. History  Substance Use Topics  . Smoking status: Not on file  . Smokeless tobacco: Not on file  . Alcohol Use: Not on file    Review of Systems  Unable to perform ROS: Patient unresponsive      Allergies  Review of patient's allergies indicates not on file.  Home Medications  No current outpatient prescriptions on file. BP 112/60  Pulse 125  Temp(Src) 95.2 F (35.1 C)  Resp 17  Ht 5\' 6"  (1.676 m)  Wt 115 lb (52.164 kg)  BMI 18.57 kg/m2  SpO2 100% Physical Exam  Nursing note and vitals reviewed.  53 year old male who is unresponsive. Vital signs are significant for tachycardia with heart rate of 125. Oxygen saturation is 100%,  which is normal. Head is normocephalic and atraumatic. Pupils are 4 mm and nonreactive. Oropharynx is clear. King airway is in place Neck is supple without adenopathy or JVD. Lungs sounds are decreased on the left without rales, wheezes, or rhonchi. Chest movement is symmetric. Heart has regular rate and rhythm without murmur. Abdomen is soft, flat,with feeding tube in place. There are no masses or hepatosplenomegaly. Extremities have no cyanosis or edema. Skin is warm and dry without rash. Neurologic: GCS=3.  ED Course  Procedures (including critical care time) INTUBATION Performed by: [facility]  Required items: required blood products, implants, devices, and special equipment available Patient identity confirmed: provided demographic data and hospital-assigned identification number Time out: Immediately prior to procedure a "time out" was called to verify the correct patient, procedure, equipment, support staff and site/side marked as required.  Indications: Cardiac arrest   Intubation method: Direct Laryngoscopy   Preoxygenation: BVT [facility] Airway)  Sedatives: none Paralytic: none  Tube Size: 7.5 cuffed  Occasional spontaneous respirations were noted during intubation. There is no direct visual evidence of his head and neck cancer.   Post-procedure assessment: chest rise and ETCO2 monitor Breath sounds: equal and absent over the epigastrium Tube secured with: ETT holder Chest x-ray interpreted by radiologist and me.  Chest x-ray findings: endotracheal tube in appropriate position  Patient tolerated the procedure well with no immediate complications.    Labs Review Results for orders placed during the hospital encounter of 07/18/2013  CBC WITH  DIFFERENTIAL      Result Value Ref Range   WBC 36.2 (*) 4.0 - 10.5 K/uL   RBC 3.68 (*) 4.22 - 5.81 MIL/uL   Hemoglobin 11.8 (*) 13.0 - 17.0 g/dL   HCT 35.6 (*) 39.0 - 52.0 %   MCV 96.7  78.0 - 100.0 fL   MCH 32.1  26.0 -  34.0 pg   MCHC 33.1  30.0 - 36.0 g/dL   RDW 14.3  11.5 - 15.5 %   Platelets 640 (*) 150 - 400 K/uL   Neutrophils Relative % 81 (*) 43 - 77 %   Lymphocytes Relative 7 (*) 12 - 46 %   Monocytes Relative 11  3 - 12 %   Eosinophils Relative 1  0 - 5 %   Basophils Relative 0  0 - 1 %   Neutro Abs 29.3 (*) 1.7 - 7.7 K/uL   Lymphs Abs 2.5  0.7 - 4.0 K/uL   Monocytes Absolute 4.0 (*) 0.1 - 1.0 K/uL   Eosinophils Absolute 0.4  0.0 - 0.7 K/uL   Basophils Absolute 0.0  0.0 - 0.1 K/uL   WBC Morphology ATYPICAL LYMPHOCYTES     Smear Review PLATELET CLUMPS NOTED ON SMEAR    COMPREHENSIVE METABOLIC PANEL      Result Value Ref Range   Sodium 141  137 - 147 mEq/L   Potassium 3.0 (*) 3.7 - 5.3 mEq/L   Chloride 99  96 - 112 mEq/L   CO2 23  19 - 32 mEq/L   Glucose, Bld 298 (*) 70 - 99 mg/dL   BUN 19  6 - 23 mg/dL   Creatinine, Ser 0.80  0.50 - 1.35 mg/dL   Calcium 9.6  8.4 - 10.5 mg/dL   Total Protein 7.1  6.0 - 8.3 g/dL   Albumin 2.3 (*) 3.5 - 5.2 g/dL   AST 58 (*) 0 - 37 U/L   ALT 60 (*) 0 - 53 U/L   Alkaline Phosphatase 102  39 - 117 U/L   Total Bilirubin 0.2 (*) 0.3 - 1.2 mg/dL   GFR calc non Af Amer >90  >90 mL/min   GFR calc Af Amer >90  >90 mL/min  PROTIME-INR      Result Value Ref Range   Prothrombin Time 15.5 (*) 11.6 - 15.2 seconds   INR 1.26  0.00 - 1.49  APTT      Result Value Ref Range   aPTT 33  24 - 37 seconds  TROPONIN I      Result Value Ref Range   Troponin I 0.53 (*) <0.30 ng/mL  CG4 I-STAT (LACTIC ACID)      Result Value Ref Range   Lactic Acid, Venous 7.36 (*) 0.5 - 2.2 mmol/L  POCT I-STAT 3, BLOOD GAS (G3+)      Result Value Ref Range   pH, Arterial 7.480 (*) 7.350 - 7.450   pCO2 arterial 35.4  35.0 - 45.0 mmHg   pO2, Arterial 261.0 (*) 80.0 - 100.0 mmHg   Bicarbonate 26.3 (*) 20.0 - 24.0 mEq/L   TCO2 27  0 - 100 mmol/L   O2 Saturation 100.0     Acid-Base Excess 3.0 (*) 0.0 - 2.0 mmol/L   Patient temperature 98.6 F     Collection site RADIAL, ALLEN'S TEST  ACCEPTABLE     Drawn by Operator     Sample type ARTERIAL     Imaging Review Dg Chest Portable 1 View  30-Jul-2013   CLINICAL DATA:  Central line placement  EXAM: PORTABLE CHEST - 1 VIEW  COMPARISON:  07/23/2013 at 6:04 a.m.  FINDINGS: Endotracheal tube ends in the mid thoracic trachea. New left IJ central venous catheter, tip at the level of the mid SVC.  Normal heart size and mediastinal contours. Hyperinflated lungs without new opacification. The retrocardiac lung is not well evaluated due to overlapping external defibrillator pads. No effusion or pneumothorax.  IMPRESSION: New left IJ catheter in good position.  No pneumothorax.   Electronically Signed   By: Jorje Guild M.D.   On: 07/28/2013 07:02   Dg Chest Portable 1 View  07/12/2013   CLINICAL DATA:  Check ETT position.  EXAM: PORTABLE CHEST - 1 VIEW  COMPARISON:  06/29/2013  FINDINGS: Endotracheal tube is 4 cm above the carina. Heart is normal size. Patchy airspace disease in the left lung base. Right lung is clear. No effusions or acute bony abnormality.  IMPRESSION: Endotracheal tube 4 cm above the carina.  Patchy left lower lobe atelectasis or infiltrate.   Electronically Signed   By: Rolm Baptise M.D.   On: 07/23/2013 06:35   Images viewed by me.   Date: 07/18/2013  Rate: 86  Rhythm: normal sinus rhythm and premature atrial contractions (PAC)  QRS Axis: normal  Intervals: QT prolonged  ST/T Wave abnormalities: nonspecific ST changes  Conduction Disutrbances:none  Narrative Interpretation:  Occasional premature atrial contraction, borderline prolonged QT interval. No prior ECG available for comparison.  Old EKG Reviewed: none available  CRITICAL CARE Performed by: [id number] Total critical care time: 80 minutes Critical care time was exclusive of separately billable procedures and treating other patients. Critical care was necessary to treat or prevent imminent or life-threatening deterioration. Critical care was time spent  personally by me on the following activities: development of treatment plan with patient and/or surrogate as well as nursing, discussions with consultants, evaluation of patient's response to treatment, examination of patient, obtaining history from patient or surrogate, ordering and performing treatments and interventions, ordering and review of laboratory studies, ordering and review of radiographic studies, pulse oximetry and re-evaluation of patient's condition.   MDM   Final diagnoses:  Cardiac arrest  Elevated lactic acid level  Elevated troponin  Anemia  Hypokalemia    Successful resuscitation from out of hospital cardiac arrest. He has been hemodynamically stable in the ED although is maintained on pressors. Case is discussed with critical care medicine and decision was made to proceed with hypothermia protocol. ECG did not show STEMI and so emergent cardiology consultation is not needed. Troponin has come back elevated indicating that he does have a non-STEMI. No family has arrived with patient. Critical care medicine has been consulted to admit the patient.  After above that occurred, family member has called back. This was a sister-in-law who states that he would had been admitted to [facility] for his cancer evaluation and had been discharged 3 days ago. He apparently had been in generally declining health before getting much worse this morning prior to the ambulance call. She is unable to give any other history other than that he has a smoking history and a drinking history.    Delora Fuel, [place] [phone]

## 2013-07-16 NOTE — Progress Notes (Addendum)
Low b/p Dr Melvyn Novas notified. Orders given.

## 2013-07-16 NOTE — Progress Notes (Signed)
Name: Brandon Aguirre MRN: [id number] DOB: 06-23-1960  ELECTRONIC ICU PHYSICIAN NOTE  Problem:  Shock, currently on levophed 10    Intake/Output Summary (Last 24 hours) at 07/03/2013 1546 Last data filed at 07/02/2013 1519  Gross per 24 hour  Intake 2349.95 ml  Output   1184 ml  Net 1165.95 ml     CVP:  [3 mmHg] 3 mmHg    Intervention:  Vol expand x 1 liter per low cvp/low uop  Christinia Gully 07/18/2013, 3:45 PM

## 2013-07-16 NOTE — Progress Notes (Signed)
Stat EEG completed at bedside.  Dr. Doy Mince aware.

## 2013-07-16 NOTE — ED Notes (Addendum)
Per EMS CPR x 20 minutes. EMS administer epi x 2 and Dopamine at 5 mcg/kg

## 2013-07-16 NOTE — Procedures (Signed)
Arterial Catheter Insertion Procedure Note Brandon Aguirre [id number] 1960/12/28  Procedure: Insertion of Arterial Catheter  Indications: Blood pressure monitoring  Procedure Details Consent: Unable to obtain consent because of altered level of consciousness. Time Out: Verified patient identification, verified procedure, site/side was marked, verified correct patient position, special equipment/implants available, medications/allergies/relevent history reviewed, required imaging and test results available.  Performed  Maximum sterile technique was used including cap, gloves, gown, hand hygiene and mask. Skin prep: Chlorhexidine; local anesthetic administered 22 gauge catheter was attempted twice on left & twice on right per protocol using the Seldinger technique.  Evaluation Blood flow good; BP tracing unable to obtain . Complications: No apparent complications.  Arterial line placement was unsucessful. Attempted per protocol. Dr Brandon Aguirre notified that procedure was unsucessful.   Brandon Aguirre 07/18/2013

## 2013-07-16 NOTE — Progress Notes (Addendum)
Clinical Social Work Department BRIEF PSYCHOSOCIAL ASSESSMENT 07/25/2013  Patient:  Brandon Aguirre, Brandon Aguirre     Account Number:  [id number]     Admit date:  07/04/2013  Clinical Social Worker:  Freeman Caldron  Date/Time:  07/01/2013 11:07 AM  Referred by:  Physician  Date Referred:  07/12/2013 Referred for  Finding medical decision maker/next-of-kin   Other Referral:   Interview type:  MD Other interview type:    PSYCHOSOCIAL DATA Living Status:  ALONE Admitted from facility:   Level of care:   Primary support name:  Brandon Aguirre [phone]) Primary support relationship to patient:  FAMILY Degree of support available:   Fair--Pt has support from sister-in-law, who checked on pt via telephone call to ED doctor. Primary care office states pt living alone and gets some support from sister-in-law but pt lives alone and support is difficult to assess.    CURRENT CONCERNS Current Concerns  Post-Acute Placement   Other Concerns:    SOCIAL WORK ASSESSMENT / PLAN CSW received consult from MD to complete advanced directives. 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 MD asked CSW to find next-of-kin or HCPOA for decision making. CSW called phone numbers originally listed on chart, and both were out of order/disconnected. CSW called pt's primary care office and got phone numbers for a Caryl Bis (office unsure of relation but state pt listed him as an emergency contact in 2009) and pt's home number [phone]). CSW called home number and Brandon Aguirre answered the phone. Brandon Aguirre states she is pt's sister-in-law, and that she spoke with an MD this morning and realizes pt is in the hospital. CSW explained attending is trying to reach her to discuss disposition, and provided nursing station phone number and pt's RN phone number to pt to follow up with any questions. CSW put phone numbers on pt's electronic chart  and informed MD. Brandon Aguirre expressed concern for pt, and CSW provided support. MD will follow up.   Assessment/plan status:  No Further Intervention Required Other assessment/ plan:   Information/referral to community resources:   Identify next-of-kin for decision making    PATIENT'S/FAMILY'S RESPONSE TO PLAN OF CARE: Good--pt's sister-in-law expressed concern about pt and that she has spoken with an MD this morning. CSW got pt's contact information and assured her MD will give her a call and that they were interested in meeting with her tomorrow morning. Brandon Aguirre states this will depend on the weather, and CSW explained CSW will inform MD of contact numbers to reach her.       Ky Barban, MSW, [facility] Clinical Social Worker [phone]

## 2013-07-16 NOTE — Progress Notes (Signed)
Echocardiogram 2D Echocardiogram limited has been performed.  Brandon Aguirre 07/26/2013, 3:06 PM

## 2013-07-17 DIAGNOSIS — E43 Unspecified severe protein-calorie malnutrition: Secondary | ICD-10-CM | POA: Insufficient documentation

## 2013-07-17 LAB — BLOOD GAS, ARTERIAL
ACID-BASE DEFICIT: 0.4 mmol/L (ref 0.0–2.0)
Bicarbonate: 23.9 mEq/L (ref 20.0–24.0)
Drawn by: 331761
FIO2: 0.4 %
MECHVT: 500 mL
O2 Saturation: 97.2 %
PATIENT TEMPERATURE: 91.4
PEEP: 5 cmH2O
RATE: 14 resp/min
TCO2: 25.2 mmol/L (ref 0–100)
pCO2 arterial: 33.7 mmHg — ABNORMAL LOW (ref 35.0–45.0)
pH, Arterial: 7.444 (ref 7.350–7.450)
pO2, Arterial: 73 mmHg — ABNORMAL LOW (ref 80.0–100.0)

## 2013-07-17 LAB — LEGIONELLA ANTIGEN, URINE: Legionella Antigen, Urine: NEGATIVE

## 2013-07-17 LAB — GLUCOSE, CAPILLARY
GLUCOSE-CAPILLARY: 100 mg/dL — AB (ref 70–99)
GLUCOSE-CAPILLARY: 121 mg/dL — AB (ref 70–99)
GLUCOSE-CAPILLARY: 128 mg/dL — AB (ref 70–99)
GLUCOSE-CAPILLARY: 131 mg/dL — AB (ref 70–99)
GLUCOSE-CAPILLARY: 167 mg/dL — AB (ref 70–99)
Glucose-Capillary: 140 mg/dL — ABNORMAL HIGH (ref 70–99)
Glucose-Capillary: 134 mg/dL — ABNORMAL HIGH (ref 70–99)
Glucose-Capillary: 102 mg/dL — ABNORMAL HIGH (ref 70–99)
Glucose-Capillary: 118 mg/dL — ABNORMAL HIGH (ref 70–99)
Glucose-Capillary: 107 mg/dL — ABNORMAL HIGH (ref 70–99)
Glucose-Capillary: 155 mg/dL — ABNORMAL HIGH (ref 70–99)
Glucose-Capillary: 105 mg/dL — ABNORMAL HIGH (ref 70–99)

## 2013-07-17 LAB — POCT I-STAT, CHEM 8
BUN: 12 mg/dL (ref 6–23)
BUN: 11 mg/dL (ref 6–23)
BUN: 11 mg/dL (ref 6–23)
CHLORIDE: 105 meq/L (ref 96–112)
CHLORIDE: 106 meq/L (ref 96–112)
CREATININE: 0.5 mg/dL (ref 0.50–1.35)
CREATININE: 0.5 mg/dL (ref 0.50–1.35)
Calcium, Ion: 1.23 mmol/L (ref 1.12–1.23)
Calcium, Ion: 1.19 mmol/L (ref 1.12–1.23)
Calcium, Ion: 1.16 mmol/L (ref 1.12–1.23)
Chloride: 106 mEq/L (ref 96–112)
Creatinine, Ser: 0.7 mg/dL (ref 0.50–1.35)
GLUCOSE: 157 mg/dL — AB (ref 70–99)
Glucose, Bld: 172 mg/dL — ABNORMAL HIGH (ref 70–99)
Glucose, Bld: 127 mg/dL — ABNORMAL HIGH (ref 70–99)
HCT: 33 % — ABNORMAL LOW (ref 39.0–52.0)
HCT: 34 % — ABNORMAL LOW (ref 39.0–52.0)
HCT: 33 % — ABNORMAL LOW (ref 39.0–52.0)
HEMOGLOBIN: 11.6 g/dL — AB (ref 13.0–17.0)
Hemoglobin: 11.2 g/dL — ABNORMAL LOW (ref 13.0–17.0)
Hemoglobin: 11.2 g/dL — ABNORMAL LOW (ref 13.0–17.0)
POTASSIUM: 3.4 meq/L — AB (ref 3.7–5.3)
POTASSIUM: 3.8 meq/L (ref 3.7–5.3)
Potassium: 3.8 mEq/L (ref 3.7–5.3)
SODIUM: 145 meq/L (ref 137–147)
SODIUM: 145 meq/L (ref 137–147)
Sodium: 145 mEq/L (ref 137–147)
TCO2: 23 mmol/L (ref 0–100)
TCO2: 23 mmol/L (ref 0–100)
TCO2: 23 mmol/L (ref 0–100)

## 2013-07-17 LAB — BASIC METABOLIC PANEL
BUN: 12 mg/dL (ref 6–23)
CHLORIDE: 109 meq/L (ref 96–112)
CO2: 20 mEq/L (ref 19–32)
Calcium: 7.8 mg/dL — ABNORMAL LOW (ref 8.4–10.5)
Creatinine, Ser: 0.49 mg/dL — ABNORMAL LOW (ref 0.50–1.35)
GFR calc non Af Amer: >90 mL/min (ref >90)
Glucose, Bld: 140 mg/dL — ABNORMAL HIGH (ref 70–99)
POTASSIUM: 4.3 meq/L (ref 3.7–5.3)
SODIUM: 143 meq/L (ref 137–147)

## 2013-07-17 LAB — URINE CULTURE
CULTURE: NO GROWTH
Colony Count: NO GROWTH

## 2013-07-17 LAB — CBC
HEMATOCRIT: 32 % — AB (ref 39.0–52.0)
Hemoglobin: 10.7 g/dL — ABNORMAL LOW (ref 13.0–17.0)
MCH: 31.4 pg (ref 26.0–34.0)
MCHC: 33.4 g/dL (ref 30.0–36.0)
MCV: 93.8 fL (ref 78.0–100.0)
PLATELETS: 459 10*3/uL — AB (ref 150–400)
RBC: 3.41 MIL/uL — ABNORMAL LOW (ref 4.22–5.81)
RDW: 14.6 % (ref 11.5–15.5)
WBC: 36.1 10*3/uL — AB (ref 4.0–10.5)

## 2013-07-17 LAB — TROPONIN I: Troponin I: 2.39 ng/mL (ref <0.30)

## 2013-07-17 LAB — MAGNESIUM: Magnesium: 2.1 mg/dL (ref 1.5–2.5)

## 2013-07-17 LAB — PROCALCITONIN: Procalcitonin: 1.9 ng/mL

## 2013-07-17 LAB — PHOSPHORUS: PHOSPHORUS: 2.4 mg/dL (ref 2.3–4.6)

## 2013-07-17 MED ORDER — POTASSIUM CHLORIDE 10 MEQ/50ML IV SOLN
10.0000 meq | INTRAVENOUS | Status: AC
  Administered 2013-07-17 (×5): 10 meq via INTRAVENOUS
  Filled 2013-07-17 (×6): qty 50

## 2013-07-17 MED ORDER — ARTIFICIAL TEARS OP OINT
TOPICAL_OINTMENT | Freq: Three times a day (TID) | OPHTHALMIC | Status: DC
  Administered 2013-07-17 – 2013-07-18 (×4): via OPHTHALMIC
  Filled 2013-07-17: qty 3.5

## 2013-07-17 MED ORDER — SODIUM CHLORIDE 0.9 % IV SOLN
1.0000 mg/h | INTRAVENOUS | Status: DC
  Administered 2013-07-17 – 2013-07-18 (×2): 2 mg/h via INTRAVENOUS
  Administered 2013-07-18: 6 mg/h via INTRAVENOUS
  Filled 2013-07-17 (×3): qty 10

## 2013-07-17 MED ORDER — SODIUM CHLORIDE 0.9 % IV SOLN
25.0000 ug/h | INTRAVENOUS | Status: DC
  Administered 2013-07-17: 200 ug/h via INTRAVENOUS
  Administered 2013-07-18: 400 ug/h via INTRAVENOUS
  Administered 2013-07-18 – 2013-07-19 (×2): 25 ug/h via INTRAVENOUS
  Filled 2013-07-17 (×4): qty 50

## 2013-07-17 MED ORDER — LEVETIRACETAM 500 MG/5ML IV SOLN
500.0000 mg | Freq: Two times a day (BID) | INTRAVENOUS | Status: DC
  Administered 2013-07-17 – 2013-07-19 (×4): 500 mg via INTRAVENOUS
  Filled 2013-07-17 (×5): qty 5

## 2013-07-17 NOTE — Progress Notes (Signed)
Chaplain responded to spiritual consult. Pt was not responsive and had no local family. Chaplain asked nurse to call if family does arrive or pt becomes more alert.

## 2013-07-17 NOTE — Progress Notes (Signed)
Notified MD about pt's MAP ranging 98-78 and low urin output recorded. MD comfortable with ranges. RN will continue to monitors vitals and output.

## 2013-07-17 NOTE — Progress Notes (Signed)
[facility] Progress Note Patient Name: Brandon Aguirre DOB: 1960-08-19 MRN: [id number]  Date of Service  07/17/2013   HPI/Events of Note   Non-specific twitching, likely myoclonus  eICU Interventions  keppra Repeat EEG now that he is warm   Intervention Category Major Interventions: Seizures - evaluation and management  Preethi Scantlebury 07/17/2013, 5:46 PM

## 2013-07-17 NOTE — Progress Notes (Signed)
eLink Physician-Brief Progress Note Patient Name: Brandon Aguirre DOB: 1960/11/25 MRN: [id number]  Date of Service  07/17/2013   HPI/Events of Note   Increased work of breathing Agitation, but no purposeful movements noted  eICU Interventions  Restart fentanyl gtt   Intervention Category Major Interventions: Respiratory failure - evaluation and management Minor Interventions: Agitation / anxiety - evaluation and management  Marcie Shearon 07/17/2013, 9:08 PM

## 2013-07-17 NOTE — Progress Notes (Addendum)
Pulmonary/Critical Care Progress Note   Name: Brandon Aguirre MRN: [id number] DOB: March 20, 1961    ADMISSION DATE:  07/15/2013   REFERRING MD :  EDP PRIMARY SERVICE:PCCM  CHIEF COMPLAINT: PEA arrest  BRIEF PATIENT DESCRIPTION:   53 yo WM with little known PMH , presents with post arrests, down time 15 minutes, CPR x 5 minutes , 1 epi with return of pulse. Transported via [facility] to [facility] ED and king airway changed to OTT. 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 No other information or family available.  STaff MD note: Per EMS RN and Dr Roxanne Mins info obtained later who got infrom sister in law: Discharged from [facility] on 07/13/13 following throat cancer workup. Post dc not doing well and not eating and having dyspnea. Family found him in bathroom. Asystole on EMS arrival. And epi x 1 with ROSC. Then enroute PEA with epi x 2 and arrived on dopamine  SIGNIFICANT EVENTS / STUDIES:  2/16 hypothermia>>  LINES / TUBES: 2/16 ott>> 2/16 Lt I J cvl>>  CULTURES: 2/16 bc x 2>> 2/16 uc>> 2/16 sputum>>  ANTIBIOTICS: 2/16 vanc>> 2/16 zoysn>>  PAST MEDICAL HISTORY :  No past medical history on file. No past surgical history on file. Prior to Admission medications   Not on File   No Known Allergies  FAMILY HISTORY:  No family history on file. SOCIAL HISTORY:  has no tobacco, alcohol, and drug history on file.  REVIEW OF SYSTEMS: Unattainable, sedated and intubated.  SUBJECTIVE:   VITAL SIGNS: Temp:  [91.2 F (32.9 C)-91.6 F (33.1 C)] 91.6 F (33.1 C) (02/17 0840) Pulse Rate:  [58-93] 74 (02/17 0757) Resp:  [11-16] 14 (02/17 0757) BP: (90-166)/(57-82) 110/82 mmHg (02/17 0800) SpO2:  [93 %-100 %] 100 % (02/17 0757) FiO2 (%):  [40 %-60 %] 40 % (02/17 0757) Weight:  [52.1 kg (114 lb 13.8 oz)] 52.1 kg (114 lb 13.8 oz) (02/17 0456)  HEMODYNAMICS: CVP:  [0 mmHg-9 mmHg] 0 mmHg  VENTILATOR SETTINGS: Vent Mode:  [-] PRVC FiO2 (%):  [40 %-60 %] 40 % Set Rate:  [14  bmp] 14 bmp Vt Set:  [500 mL] 500 mL PEEP:  [5 cmH20] 5 cmH20 Plateau Pressure:  [14 cmH20-15 cmH20] 15 cmH20  INTAKE / OUTPUT: Intake/Output     02/16 0701 - 02/17 0700 02/17 0701 - 02/18 0700   I.V. (mL/kg) 1383 (26.5) 70.9 (1.4)   Other 1200    IV Piggyback 1600    Total Intake(mL/kg) 4183 (80.3) 70.9 (1.4)   Urine (mL/kg/hr) 1499 (1.2) 25 (0.2)   Total Output 1499 25   Net +2684 +45.9         PHYSICAL EXAMINATION: General:  Thin wm on vent with recent nmb Neuro: sedated on vent, ?? Seizure movement on eyelids and face HEENT: no jvd/lan Cardiovascular:  HSR RRR Lungs:  CTA, air hungry or current vent settings Abdomen:  Flat, G tube in place Musculoskeletal:  Intact Skin:  warm  LABS: PULMONARY  Recent Labs Lab 07/05/2013 0641  07/12/2013 2050 07/14/2013 2333 07/17/13 0107 07/17/13 0314 07/17/13 0517  PHART 7.480*  --  7.403  --   --   --  7.444  PCO2ART 35.4  --  38.6  --   --   --  33.7*  PO2ART 261.0*  --  91.3  --   --   --  73.0*  HCO3 26.3*  --  24.9*  --   --   --  23.9  TCO2  27  < > 26.4 24 23 23  25.2  O2SAT 100.0  --  98.1  --   --   --  97.2  < > = values in this interval not displayed.  CBC  Recent Labs Lab 07/01/2013 0600 07/05/2013 0741  07/17/13 0107 07/17/13 0314 07/17/13 0500  HGB 11.8* 11.2*  < > 11.2* 11.6* 10.7*  HCT 35.6* 33.9*  < > 33.0* 34.0* 32.0*  WBC 36.2* 38.9*  --   --   --  36.1*  PLT 640* 479*  --   --   --  459*  < > = values in this interval not displayed.  COAGULATION  Recent Labs Lab 07/02/2013 0600 07/21/2013 0741  INR 1.26 1.34    CARDIAC    Recent Labs Lab 07/03/2013 0600 07/24/2013 1100 07/06/2013 1845 07/03/2013 2327  TROPONINI 0.53* 2.42* 2.60* 2.39*   No results found for this basename: PROBNP,  in the last 168 hours   CHEMISTRY  Recent Labs Lab 07/27/2013 0600 07/18/2013 0741  07/13/2013 1855 07/24/2013 2333 07/17/13 0107 07/17/13 0314 07/17/13 0500  NA 141 143  < > 148* 147 145 145 143  K 3.0* 2.9*  < > 3.0*  3.5* 3.4* 3.8 4.3  CL 99 107  < > 107 106 105 106 109  CO2 23 25  --   --   --   --   --  20  GLUCOSE 298* 247*  < > 126* 118* 172* 157* 140*  BUN 19 19  < > 14 12 12 11 12   CREATININE 0.80 0.69  < > 0.60 0.50 0.50 0.50 0.49*  CALCIUM 9.6 8.6  --   --   --   --   --  7.8*  MG  --  2.3  --   --   --   --   --  2.1  PHOS  --  3.0  --   --   --   --   --  2.4  < > = values in this interval not displayed. Estimated Creatinine Clearance: 77.8 ml/min (by C-G formula based on Cr of 0.49).  LIVER  Recent Labs Lab 07/08/2013 0600 07/06/2013 0741  AST 58* 63*  ALT 60* 57*  ALKPHOS 102 82  BILITOT 0.2* 0.3  PROT 7.1 6.2  ALBUMIN 2.3* 2.1*  INR 1.26 1.34   INFECTIOUS  Recent Labs Lab 07/12/2013 0604 07/08/2013 0741 07/28/2013 1100 07/17/13 0500  LATICACIDVEN 7.36* 2.6* 1.5  --   PROCALCITON  --  <0.10  --  1.90   ENDOCRINE CBG (last 3)   Recent Labs  07/17/13 0004 07/17/13 0440 07/17/13 0816  GLUCAP 128* 155* 121*   IMAGING x48h  Ct Head W Wo Contrast  07/05/2013   CLINICAL DATA:  Cardiac arrest. Rule out intracranial hemorrhage. Anoxia.  EXAM: CT HEAD WITHOUT AND WITH CONTRAST  CT NECK WITH CONTRAST  TECHNIQUE: Contiguous axial images were obtained from the base of the skull through the vertex without and with intravenous contrast  Multidetector CT imaging of the and neck was performed using the standard protocol following the bolus administration of intravenous contrast.  CONTRAST:  65mL OMNIPAQUE IOHEXOL 300 MG/ML  SOLN  COMPARISON:  01/07/2008 head CT.  06/29/2013 neck CT  FINDINGS: CT HEAD FINDINGS  Skull and Sinuses:Diffuse inflammatory mucosal thickening. Remote right mid face fracture status post open reduction internal fixation.  Orbits: No acute abnormality.  Brain: No evidence of acute abnormality, such as acute infarction, hemorrhage, hydrocephalus, or  mass lesion/mass effect. No abnormal intracranial enhancement. Punctate density in the subarachnoid space of the high and  posterior left frontal lobe was likely present 2009, most likely calcification.  CT NECK FINDINGS  Bilateral anterior first and second rib fractures, nondisplaced. There is no evidence of pneumothorax. Centrilobular emphysema. Bilateral ground-glass opacities in the posterior upper lobes, favor aspiration. Small left pleural effusion which appears water density.  Large, heterogeneously enhancing mucosal based mass in the circumferential upper aerodigestive tract, extending from the nasopharynx -which is completely obliterated -to the level of the hypopharynx and supraglottic larynx. The mass invades the bilateral prevertebral space at the level of the nasopharynx. Tumor extends to the left masticator space, without definite invasion. The volume of mass is markedly increased from recent imaging, which may reflect progression and superimposed treatment related edema or edema related to recent intubation. There is bilateral enlarged and necrotic lymph nodes in stations 2, 3, and 4. The largest nodal conglomerate is in the left level 2 station, measuring 3.4 x 2.7 cm (previously 2.4 x 1.4 cm). The left mid internal jugular vein is completely effaced, without thrombosis in the proximal or distal segments. The distal left internal carotid artery is flattened by tumor, at the level of the styloid process, were there is at least 270 degrees of tumor. Parapharyngeal and retropharyngeal edema without evidence of acute collection.  An endotracheal tube successfully traverses the large supraglottic mass, and ends in the upper thoracic trachea.  IMPRESSION: 1. Necrotic tumor extending from the nasopharynx to the hypopharynx, with bilateral metastatic adenopathy at stations 2 through 4. The malignancy is diffusely progressive since 06/29/2013, consistent with disease progression -possibly with superimposed treatment related change. Tumor extent is further described above, including prevertebral invasion and left ICA involvement.  2. Endotracheal tube in good position. 3. Bilateral anterior second and third rib fractures after CPR. 4. Probable biapical aspiration. 5. No acute intracranial abnormality.   Electronically Signed   By: Jorje Guild M.D.   On: 05-Aug-2013 08:53   Ct Soft Tissue Neck W Contrast  08-05-2013   CLINICAL DATA:  Cardiac arrest. Rule out intracranial hemorrhage. Anoxia.  EXAM: CT HEAD WITHOUT AND WITH CONTRAST  CT NECK WITH CONTRAST  TECHNIQUE: Contiguous axial images were obtained from the base of the skull through the vertex without and with intravenous contrast  Multidetector CT imaging of the and neck was performed using the standard protocol following the bolus administration of intravenous contrast.  CONTRAST:  28mL OMNIPAQUE IOHEXOL 300 MG/ML  SOLN  COMPARISON:  01/07/2008 head CT.  06/29/2013 neck CT  FINDINGS: CT HEAD FINDINGS  Skull and Sinuses:Diffuse inflammatory mucosal thickening. Remote right mid face fracture status post open reduction internal fixation.  Orbits: No acute abnormality.  Brain: No evidence of acute abnormality, such as acute infarction, hemorrhage, hydrocephalus, or mass lesion/mass effect. No abnormal intracranial enhancement. Punctate density in the subarachnoid space of the high and posterior left frontal lobe was likely present 2009, most likely calcification.  CT NECK FINDINGS  Bilateral anterior first and second rib fractures, nondisplaced. There is no evidence of pneumothorax. Centrilobular emphysema. Bilateral ground-glass opacities in the posterior upper lobes, favor aspiration. Small left pleural effusion which appears water density.  Large, heterogeneously enhancing mucosal based mass in the circumferential upper aerodigestive tract, extending from the nasopharynx -which is completely obliterated -to the level of the hypopharynx and supraglottic larynx. The mass invades the bilateral prevertebral space at the level of the nasopharynx. Tumor extends to the left masticator  space,  without definite invasion. The volume of mass is markedly increased from recent imaging, which may reflect progression and superimposed treatment related edema or edema related to recent intubation. There is bilateral enlarged and necrotic lymph nodes in stations 2, 3, and 4. The largest nodal conglomerate is in the left level 2 station, measuring 3.4 x 2.7 cm (previously 2.4 x 1.4 cm). The left mid internal jugular vein is completely effaced, without thrombosis in the proximal or distal segments. The distal left internal carotid artery is flattened by tumor, at the level of the styloid process, were there is at least 270 degrees of tumor. Parapharyngeal and retropharyngeal edema without evidence of acute collection.  An endotracheal tube successfully traverses the large supraglottic mass, and ends in the upper thoracic trachea.  IMPRESSION: 1. Necrotic tumor extending from the nasopharynx to the hypopharynx, with bilateral metastatic adenopathy at stations 2 through 4. The malignancy is diffusely progressive since 06/29/2013, consistent with disease progression -possibly with superimposed treatment related change. Tumor extent is further described above, including prevertebral invasion and left ICA involvement. 2. Endotracheal tube in good position. 3. Bilateral anterior second and third rib fractures after CPR. 4. Probable biapical aspiration. 5. No acute intracranial abnormality.   Electronically Signed   By: Jorje Guild M.D.   On: 07/08/2013 08:53   Dg Chest Portable 1 View  07/07/2013   CLINICAL DATA:  Central line placement  EXAM: PORTABLE CHEST - 1 VIEW  COMPARISON:  07/07/2013 at 6:04 a.m.  FINDINGS: Endotracheal tube ends in the mid thoracic trachea. New left IJ central venous catheter, tip at the level of the mid SVC.  Normal heart size and mediastinal contours. Hyperinflated lungs without new opacification. The retrocardiac lung is not well evaluated due to overlapping external defibrillator  pads. No effusion or pneumothorax.  IMPRESSION: New left IJ catheter in good position.  No pneumothorax.   Electronically Signed   By: Jorje Guild M.D.   On: 07/14/2013 07:02   Dg Chest Portable 1 View  07/26/2013   CLINICAL DATA:  Check ETT position.  EXAM: PORTABLE CHEST - 1 VIEW  COMPARISON:  06/29/2013  FINDINGS: Endotracheal tube is 4 cm above the carina. Heart is normal size. Patchy airspace disease in the left lung base. Right lung is clear. No effusions or acute bony abnormality.  IMPRESSION: Endotracheal tube 4 cm above the carina.  Patchy left lower lobe atelectasis or infiltrate.   Electronically Signed   By: Rolm Baptise M.D.   On: 07/15/2013 06:35   ASSESSMENT / PLAN:  PULMONARY A: VDRF secondary to Arrest.  ? Hx of COPD P:   - Full vent support while warming. - Vent settings adjusted. - Will hold weaning trials for now. - BD as ordered.  CARDIOVASCULAR A: Asytole P:  - Hypothermia protocol. - ECHO done and pending. - Pending echo results and neuro status will consult cards.  RENAL A:  Hypokalemia P:   - BMET in AM. - Replace electrolytes as indicated.  GASTROINTESTINAL A:  G tube in place. Hx  Of Esophogeal cancer  Per scantry reports. CT neck 06/29/13 at [facility] showing Head and NEck cancer P:   - NPO. - PPI. - Place OGT and consult nutrition for TF.  HEMATOLOGIC A:  No acute issues P:  - DVT protection with lovenox (dc if CT head shows hge)  INFECTIOUS A:  Empiric abx given high risk for aspiration and presence of COPD in winter season P:   - See flows. - Vanc. -  Zosyn. - Will narrow as cultures are available.  ENDOCRINE A:  Unknown hx of DM. P:   - ISS as needed.  NEUROLOGIC A:  AMS ? Seizure Neck CT with diffuse extension of tumor to include prevertebral and left ICA invasion, progressive. P:   - CT of the head negative. - EEG once fully warmed then will consider neuro consult.  ONCOLOGY A: Esophageal cancer. CT neck 06/29/13 with  cancer in throat  P - CT neck noted as above. - Get oncology notes and prognosis pending neuro function.   GLOBAL A: Poor prognosis. No family at bedside since patient arrival to ER . Per Dr Roxanne Mins of ER who spoke to sister in law: patient is smoker, etoh and returned from [facility] in cab. Therefore, suspect poor social support.  Sister supposedly arriving from [place] or tomorrow morning, RN asked to make appointment with sister at 80 AM tomorrow.   P Social work consult Spiritual care consult  CC time 35 min.  Rush Farmer, M.D. [facility] Pulmonary/Critical Care Medicine. Pager: [phone]. After hours pager: [phone].

## 2013-07-17 NOTE — Progress Notes (Signed)
Pt had increased BP 210/184, HR 125 and RR 44. MD reorder Fentanyl and Versed. Pt's vitals stabilized after starting medication, BP127/61, HR 116, RR 27. RN will continue to monitor pt.

## 2013-07-17 NOTE — Progress Notes (Signed)
eLink Physician-Brief Progress Note Patient Name: Brandon Aguirre DOB: 1961-05-17 MRN: [id number]  Date of Service  07/17/2013   HPI/Events of Note  Persistent hypokalemia   eICU Interventions  Potassium replaced   Intervention Category Intermediate Interventions: Electrolyte abnormality - evaluation and management  Hadia Minier 07/17/2013, 1:15 AM

## 2013-07-18 ENCOUNTER — Inpatient Hospital Stay (HOSPITAL_COMMUNITY): Payer: Medicaid Other

## 2013-07-18 DIAGNOSIS — I469 Cardiac arrest, cause unspecified: Secondary | ICD-10-CM

## 2013-07-18 DIAGNOSIS — E43 Unspecified severe protein-calorie malnutrition: Secondary | ICD-10-CM

## 2013-07-18 DIAGNOSIS — R4182 Altered mental status, unspecified: Secondary | ICD-10-CM

## 2013-07-18 LAB — BLOOD GAS, ARTERIAL
ACID-BASE DEFICIT: 0 mmol/L (ref 0.0–2.0)
Bicarbonate: 23.4 mEq/L (ref 20.0–24.0)
Drawn by: 25203
FIO2: 0.4 %
MECHVT: 500 mL
O2 Saturation: 94.8 %
PEEP: 5 cmH2O
PO2 ART: 76.4 mmHg — AB (ref 80.0–100.0)
Patient temperature: 101.5
RATE: 10 resp/min
TCO2: 24.5 mmol/L (ref 0–100)
pCO2 arterial: 36.6 mmHg (ref 35.0–45.0)
pH, Arterial: 7.43 (ref 7.350–7.450)

## 2013-07-18 LAB — CBC
HCT: 28.9 % — ABNORMAL LOW (ref 39.0–52.0)
Hemoglobin: 9.6 g/dL — ABNORMAL LOW (ref 13.0–17.0)
MCH: 30.8 pg (ref 26.0–34.0)
MCHC: 33.2 g/dL (ref 30.0–36.0)
MCV: 92.6 fL (ref 78.0–100.0)
Platelets: 456 10*3/uL — ABNORMAL HIGH (ref 150–400)
RBC: 3.12 MIL/uL — ABNORMAL LOW (ref 4.22–5.81)
RDW: 14.9 % (ref 11.5–15.5)
WBC: 30.7 10*3/uL — AB (ref 4.0–10.5)

## 2013-07-18 LAB — GLUCOSE, CAPILLARY
GLUCOSE-CAPILLARY: 98 mg/dL (ref 70–99)
Glucose-Capillary: 78 mg/dL (ref 70–99)
Glucose-Capillary: 92 mg/dL (ref 70–99)
Glucose-Capillary: 74 mg/dL (ref 70–99)
Glucose-Capillary: 120 mg/dL — ABNORMAL HIGH (ref 70–99)
Glucose-Capillary: 112 mg/dL — ABNORMAL HIGH (ref 70–99)
Glucose-Capillary: 109 mg/dL — ABNORMAL HIGH (ref 70–99)

## 2013-07-18 LAB — BASIC METABOLIC PANEL
BUN: 12 mg/dL (ref 6–23)
CALCIUM: 7.3 mg/dL — AB (ref 8.4–10.5)
CHLORIDE: 106 meq/L (ref 96–112)
CO2: 24 mEq/L (ref 19–32)
CREATININE: 0.97 mg/dL (ref 0.50–1.35)
GFR calc non Af Amer: >90 mL/min (ref >90)
Glucose, Bld: 89 mg/dL (ref 70–99)
Potassium: 3.6 mEq/L — ABNORMAL LOW (ref 3.7–5.3)
Sodium: 140 mEq/L (ref 137–147)

## 2013-07-18 LAB — MAGNESIUM: MAGNESIUM: 1.8 mg/dL (ref 1.5–2.5)

## 2013-07-18 LAB — PROCALCITONIN: Procalcitonin: 1.77 ng/mL

## 2013-07-18 LAB — PHOSPHORUS: Phosphorus: 1.8 mg/dL — ABNORMAL LOW (ref 2.3–4.6)

## 2013-07-18 MED ORDER — ACETAMINOPHEN 650 MG RE SUPP
650.0000 mg | Freq: Four times a day (QID) | RECTAL | Status: DC | PRN
  Administered 2013-07-18: 650 mg via RECTAL
  Filled 2013-07-18: qty 1

## 2013-07-18 MED ORDER — SODIUM CHLORIDE 0.9 % IV SOLN: INTRAVENOUS | Status: DC

## 2013-07-18 MED ORDER — DEXTROSE 5 % IV SOLN
30.0000 mmol | Freq: Once | INTRAVENOUS | Status: AC
  Administered 2013-07-18: 30 mmol via INTRAVENOUS
  Filled 2013-07-18: qty 10

## 2013-07-18 MED ORDER — MAGNESIUM SULFATE 40 MG/ML IJ SOLN
2.0000 g | Freq: Once | INTRAMUSCULAR | Status: AC
  Administered 2013-07-18: 2 g via INTRAVENOUS
  Filled 2013-07-18: qty 50

## 2013-07-18 MED ORDER — VITAL AF 1.2 CAL PO LIQD: 1000.0000 mL | ORAL | Status: DC

## 2013-07-18 MED ORDER — VITAL AF 1.2 CAL PO LIQD
1000.0000 mL | ORAL | Status: DC
  Filled 2013-07-18 (×2): qty 1000

## 2013-07-18 MED ORDER — POTASSIUM CHLORIDE 10 MEQ/50ML IV SOLN
10.0000 meq | INTRAVENOUS | Status: AC
  Administered 2013-07-18 (×2): 10 meq via INTRAVENOUS
  Filled 2013-07-18 (×2): qty 50

## 2013-07-18 MED ORDER — NOREPINEPHRINE BITARTRATE 1 MG/ML IJ SOLN
0.5000 ug/min | INTRAVENOUS | Status: DC
  Administered 2013-07-18: 20 ug/min via INTRAVENOUS
  Administered 2013-07-18: 30 ug/min via INTRAVENOUS
  Administered 2013-07-19: 20 ug/min via INTRAVENOUS
  Filled 2013-07-18 (×3): qty 16

## 2013-07-18 NOTE — Progress Notes (Signed)
eLink Physician-Brief Progress Note Patient Name: Brandon Aguirre DOB: 1960-12-22 MRN: [id number]  Date of Service  07/18/2013   HPI/Events of Note  Temp of 103.  Completed the hypothermia protocol yesterday afternoon.  eICU Interventions  Plan: Cooling blanket Tylenol supp 650 mg q6hours prn      Saaya Procell 07/18/2013, 1:26 AM

## 2013-07-18 NOTE — Progress Notes (Signed)
BP fell to 50's.  Sedation is off and levophed is maxed at 64mcg.  Cancelled trip to CT due to VS instablility. Will reschedule when pt is more stable.

## 2013-07-18 NOTE — Progress Notes (Signed)
NUTRITION FOLLOW UP  Intervention:   If TF initiated, recommend Vital AF 1.2 at 20 ml/hr increasing by 10 ml/hr every 12 hours to reach goal rate of 50 ml/hr.  Above TF regimen provides 1440 kcal (99% of estimated needs), 90 grams protein, and 973 ml free water. Patient at risk for refeeding syndrome due to severe malnutrition. Recommend monitoring magnesium, potassium, and phosphorous for 3 days once feeds are started.  Nutrition Dx:   Inadequate oral intake related to inability to eat as evidenced by NPO status.   Goal:  Patient to meet >/=90% of estimated nutrition needs   Monitor:  Vent status, TF initiation, weight trends, lab trends  Assessment:   53 yo WM with little known PMH , presents with post arrests, down time 15 minutes, CPR x 5 minutes , 1 epi with return of pulse. Transported via [facility] to [facility] ED and king airway changed to OTT. 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  Patient is currently intubated on ventilator support.  MV: 12.2 L/min Temp (24hrs), Avg:99.4 F (37.4 C), Min:97 F (36.1 C), Max:103.1 F (39.5 C)  RD received consult for TFs.  Discussed with RN.  Pt planning for CT scan today.  Pending results, family considering withdrawal of care in setting of worsening cancer with poor prognosis. Ongoing discussions and care today. RD to place TF orders to begin tomorrow morning if plan of care remains as is.   Height: Ht Readings from Last 1 Encounters:  07/26/2013 5\' 6"  (1.676 m)    Weight Status:   Wt Readings from Last 1 Encounters:  07/18/13 115 lb 4.8 oz (52.3 kg)    Re-estimated needs:  Kcal: 1448  Protein: 80-90 grams  Fluid: >1.5 L  Skin: intact, G-tube in place  Diet Order:  NPO   Intake/Output Summary (Last 24 hours) at 07/18/13 1151 Last data filed at 07/18/13 1100  Gross per 24 hour  Intake 3220.35 ml  Output   1680 ml  Net 1540.35 ml    Last BM: 2/19  Labs:   Recent Labs Lab 07/18/2013 0741   07/17/13 0500 07/17/13 1421 07/18/13 0500  NA 143  < > 143 145 140  K 2.9*  < > 4.3 3.8 3.6*  CL 107  < > 109 106 106  CO2 25  --  20  --  24  BUN 19  < > 12 11 12   CREATININE 0.69  < > 0.49* 0.70 0.97  CALCIUM 8.6  --  7.8*  --  7.3*  MG 2.3  --  2.1  --  1.8  PHOS 3.0  --  2.4  --  1.8*  GLUCOSE 247*  < > 140* 127* 89  < > = values in this interval not displayed.  CBG (last 3)   Recent Labs  07/18/13 0005 07/18/13 0436 07/18/13 0750  GLUCAP 78 92 98    Scheduled Meds: . sodium chloride  2,000 mL Intravenous Once  . antiseptic oral rinse  15 mL Mouth Rinse QID  . artificial tears   Both Eyes 3 times per day  . chlorhexidine  15 mL Mouth Rinse BID  . enoxaparin (LOVENOX) injection  40 mg Subcutaneous Q24H  . insulin glargine  10 Units Subcutaneous QHS  . ipratropium-albuterol  3 mL Nebulization Q4H  . levETIRAcetam  500 mg Intravenous Q12H  . magnesium sulfate 1 - 4 g bolus IVPB  2 g Intravenous Once  . pantoprazole (PROTONIX) IV  40  mg Intravenous QHS  . piperacillin-tazobactam (ZOSYN)  IV  3.375 g Intravenous Q8H  . potassium phosphate IVPB (mmol)  30 mmol Intravenous Once  . vancomycin  750 mg Intravenous Q12H    Continuous Infusions: . sodium chloride 20 mL/hr at 07/18/13 0800  . sodium chloride 20 mL/hr at 07/18/13 0800  . sodium chloride 10 mL/hr at 07/18/13 0800  . dextrose    . fentaNYL infusion INTRAVENOUS 25 mcg/hr (07/18/13 1100)  . midazolam (VERSED) infusion 2 mg/hr (07/18/13 1100)  . norepinephrine (LEVOPHED) Adult infusion 30 mcg/min (07/18/13 1030)    Brynda Greathouse, MS RD LDN Clinical Inpatient Dietitian Pager: [phone] Weekend/After hours pager: [phone]

## 2013-07-18 NOTE — Progress Notes (Signed)
[facility] ADULT ICU REPLACEMENT PROTOCOL FOR AM LAB REPLACEMENT ONLY  The patient does apply for the [facility] Adult ICU Electrolyte Replacment Protocol based on the criteria listed below:   1. Is GFR >/= 40 ml/min? yes  Patient's GFR today is >90 2. Is urine output >/= 0.5 ml/kg/hr for the last 6 hours? yes Patient's UOP is 0.5 ml/kg/hr 3. Is BUN < 60 mg/dL? yes  Patient's BUN today is 12 4. Abnormal electrolyte(s):Potassium 5. Ordered repletion with: Potassium per Protocol  Danyale Ridinger P 07/18/2013 6:07 AM

## 2013-07-18 NOTE — Progress Notes (Signed)
Pulmonary/Critical Care Progress Note   Name: Brandon Aguirre MRN: [id number] DOB: 1961/02/07    ADMISSION DATE:  07/14/2013   REFERRING MD :  EDP PRIMARY SERVICE:PCCM  CHIEF COMPLAINT: PEA arrest  BRIEF PATIENT DESCRIPTION:   53 yo WM with little known PMH , presents with post arrests, down time 15 minutes, CPR x 5 minutes , 1 epi with return of pulse. Transported via [facility] to [facility] ED and king airway changed to OTT. 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 No other information or family available.  STaff MD note: Per EMS RN and Dr Roxanne Mins info obtained later who got infrom sister in law: Discharged from [place] on 07/13/13 following throat cancer workup. Post dc not doing well and not eating and having dyspnea. Family found him in bathroom. Asystole on EMS arrival. And epi x 1 with ROSC. Then enroute PEA with epi x 2 and arrived on dopamine  SIGNIFICANT EVENTS / STUDIES:  2/16 hypothermia>>  LINES / TUBES: 2/16 ott>> 2/16 Lt I J cvl>>  CULTURES: 2/16 bc x 2>> 2/16 uc>> 2/16 sputum>>  ANTIBIOTICS: 2/16 vanc>> 2/16 zoysn>>  SUBJECTIVE: patient with myoclonus immediately post warming.  Now sedated.  VITAL SIGNS: Temp:  [97 F (36.1 C)-103.1 F (39.5 C)] 97.9 F (36.6 C) (02/18 0810) Pulse Rate:  [87-125] 97 (02/18 1100) Resp:  [4-41] 23 (02/18 1100) BP: (52-210)/(26-184) 107/73 mmHg (02/18 1100) SpO2:  [88 %-100 %] 96 % (02/18 1100) FiO2 (%):  [40 %] 40 % (02/18 0818) Weight:  [52.3 kg (115 lb 4.8 oz)] 52.3 kg (115 lb 4.8 oz) (02/18 0438)  HEMODYNAMICS: CVP:  [1 mmHg-15 mmHg] 3 mmHg  VENTILATOR SETTINGS: Vent Mode:  [-] PRVC FiO2 (%):  [40 %] 40 % Set Rate:  [10 bmp-14 bmp] 10 bmp Vt Set:  [500 mL] 500 mL PEEP:  [5 cmH20] 5 cmH20 Plateau Pressure:  [7 cmH20-21 cmH20] 18 cmH20  INTAKE / OUTPUT: Intake/Output     02/17 0701 - 02/18 0700 02/18 0701 - 02/19 0700   I.V. (mL/kg) 2319.4 (44.3) 516 (9.9)   Other     IV Piggyback 710 50   Total  Intake(mL/kg) 3029.4 (57.9) 566 (10.8)   Urine (mL/kg/hr) 1455 (1.2) 325 (1.4)   Total Output 1455 325   Net +1574.4 +241         PHYSICAL EXAMINATION: General: Thin wm on vent, chronically ill appearing. Neuro: sedated on vent, myoclonus, not purposeful. HEENT: no jvd/lan Cardiovascular:  HSR RRR Lungs:  CTA. Abdomen:  Flat, G tube in place Musculoskeletal:  Intact Skin:  warm  LABS: PULMONARY  Recent Labs Lab 07/11/2013 0641  07/08/2013 2050  07/17/13 0107 07/17/13 0314 07/17/13 0517 07/17/13 1421 07/18/13 0320  PHART 7.480*  --  7.403  --   --   --  7.444  --  7.430  PCO2ART 35.4  --  38.6  --   --   --  33.7*  --  36.6  PO2ART 261.0*  --  91.3  --   --   --  73.0*  --  76.4*  HCO3 26.3*  --  24.9*  --   --   --  23.9  --  23.4  TCO2 27  < > 26.4  < > 23 23 25.2 23 24.5  O2SAT 100.0  --  98.1  --   --   --  97.2  --  94.8  < > = values in this interval not displayed.  CBC  Recent Labs Lab 07-21-2013 0741  07/17/13 0500 07/17/13 1421 07/18/13 0500  HGB 11.2*  < > 10.7* 11.2* 9.6*  HCT 33.9*  < > 32.0* 33.0* 28.9*  WBC 38.9*  --  36.1*  --  30.7*  PLT 479*  --  459*  --  456*  < > = values in this interval not displayed.  COAGULATION  Recent Labs Lab 21-Jul-2013 0600 Jul 21, 2013 0741  INR 1.26 1.34   CARDIAC  Recent Labs Lab 07-21-13 0600 07/21/2013 1100 2013-07-21 1845 07-21-2013 2327  TROPONINI 0.53* 2.42* 2.60* 2.39*   No results found for this basename: PROBNP,  in the last 168 hours  CHEMISTRY  Recent Labs Lab 2013/07/21 0600 07/21/2013 0741  07/17/13 0107 07/17/13 0314 07/17/13 0500 07/17/13 1421 07/18/13 0500  NA 141 143  < > 145 145 143 145 140  K 3.0* 2.9*  < > 3.4* 3.8 4.3 3.8 3.6*  CL 99 107  < > 105 106 109 106 106  CO2 23 25  --   --   --  20  --  24  GLUCOSE 298* 247*  < > 172* 157* 140* 127* 89  BUN 19 19  < > 12 11 12 11 12   CREATININE 0.80 0.69  < > 0.50 0.50 0.49* 0.70 0.97  CALCIUM 9.6 8.6  --   --   --  7.8*  --  7.3*  MG  --   2.3  --   --   --  2.1  --  1.8  PHOS  --  3.0  --   --   --  2.4  --  1.8*  < > = values in this interval not displayed. Estimated Creatinine Clearance: 64.4 ml/min (by C-G formula based on Cr of 0.97).  LIVER  Recent Labs Lab 07-21-2013 0600 07/21/13 0741  AST 58* 63*  ALT 60* 57*  ALKPHOS 102 82  BILITOT 0.2* 0.3  PROT 7.1 6.2  ALBUMIN 2.3* 2.1*  INR 1.26 1.34   INFECTIOUS  Recent Labs Lab July 21, 2013 0604 07-21-13 0741 2013-07-21 1100 07/17/13 0500 07/18/13 0500  LATICACIDVEN 7.36* 2.6* 1.5  --   --   PROCALCITON  --  <0.10  --  1.90 1.77   ENDOCRINE CBG (last 3)   Recent Labs  07/18/13 0005 07/18/13 0436 07/18/13 0750  GLUCAP 78 92 98   IMAGING x48h  Dg Chest Port 1 View  07/18/2013   CLINICAL DATA:  Assess ET tube position.  EXAM: PORTABLE CHEST - 1 VIEW  COMPARISON:  07/21/13  FINDINGS: Endotracheal tube is in stable position as is the left central line. New bilateral lower lobe airspace opacities. Heart is normal size. No visible effusions. No acute bony abnormality.  IMPRESSION: New bilateral lower lobe airspace opacities.   Electronically Signed   By: Rolm Baptise M.D.   On: 07/18/2013 05:09   ASSESSMENT / PLAN:  PULMONARY A: VDRF secondary to Arrest.  ? Hx of COPD P:   - Full vent support given neuro status. - Vent settings adjusted. - BD as ordered.  CARDIOVASCULAR A: Asytole P:  - Hypothermia protocol. - ECHO done and pending. - Pending echo results and neuro status will consult cards.  RENAL A:  Hypokalemia P:   - BMET in AM. - Replace electrolytes as indicated.  GASTROINTESTINAL A:  G tube in place. Hx  Of Esophogeal cancer  Per scantry reports. CT neck 06/29/13 at [facility] and NEck cancer P:   - Start TF. -  PPI.  HEMATOLOGIC A:  No acute issues P:  - DVT protection with lovenox (dc if CT head shows hge)  INFECTIOUS A:  Empiric abx given high risk for aspiration and presence of COPD in winter season P:   - See  flows. - Vanc. - Zosyn. - Will narrow as cultures are available.  ENDOCRINE A:  Unknown hx of DM. P:   - ISS as needed.  NEUROLOGIC A:  AMS ? Seizure Neck CT with diffuse extension of tumor to include prevertebral and left ICA invasion, progressive. P:   - CT of the head negative, will repeat today per neuro recommendations. - EEG done. - Neuro consult input appreciated.  ONCOLOGY A: Esophageal cancer. CT neck 06/29/13 with cancer in throat  P - CT neck noted as above. - If neuro improved then will consider oncology input.   GLOBAL Sister arrived from [place].  She is sure patient would not want to live in this condition.  Made full DNR and if deteriorates then comfort.  Once neuro has completed prognostic work then will consider withdrawal if no hope of improvement.  CC time 35 min.  Rush Farmer, M.D. [facility] Pulmonary/Critical Care Medicine. Pager: [phone]. After hours pager: [phone].

## 2013-07-18 NOTE — Consult Note (Signed)
Neurology Consultation Reason for Consult: Prognosis Referring Physician: Jennet Maduro  CC: Altered Mental Status  History is obtained from: Patient  HPI: Brandon Aguirre is a 53 y.o. male with a history of cacner who suffered an arrest on 02/16 with 15 minutes of downtime and CPR x 5 minutes. He underwent cooling protocol and since re-warming has not had as much improvement as had been hoped. Neurology was consulted for prognosis.   His siblings are not sure about what his cancer treatment has been. IT appears that he was seen for initial rad onc visit on 2/12. 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 He had not had pet CT yet to evaluate extent of disease.    ROS: Unable to assess secondary to patient's altered mental status.    PMH:  Head and neck cancer  Family History: Unable to assess secondary to patient's altered mental status.    Social History: Tob: Unable to assess secondary to patient's altered mental status.    Exam: Current vital signs: BP 125/60  Pulse 99  Temp(Src) 97.9 F (36.6 C) (Oral)  Resp 28  Ht 5\' 6"  (1.676 m)  Wt 52.3 kg (115 lb 4.8 oz)  BMI 18.62 kg/m2  SpO2 96% Vital signs in last 24 hours: Temp:  [93.6 F (34.2 C)-103.1 F (39.5 C)] 97.9 F (36.6 C) (02/18 0810) Pulse Rate:  [87-125] 99 (02/18 0900) Resp:  [4-41] 28 (02/18 0900) BP: (96-210)/(47-184) 125/60 mmHg (02/18 0900) SpO2:  [88 %-100 %] 96 % (02/18 0900) FiO2 (%):  [40 %] 40 % (02/18 0818) Weight:  [52.3 kg (115 lb 4.8 oz)] 52.3 kg (115 lb 4.8 oz) (02/18 0438)  General: in bed, intubated CV: RRR Mental Status: Patietn does nto open eyes or follow commands.  Cranial Nerves: II: Does not blink to threat. Pupils are equal, round, and reactive to light.  Discs are difficult to visualize III,IV, VI: no movement with dolls V,VII: corneals intact VIII, X, XI, XII: Unable to assess secondary to  patient's altered mental status.  Motor: Tone is normal. Bulk is normal. Minimal flexion bilateral lower extremities, no movement uppers.  Sensory: As above Deep Tendon Reflexes: 2+ and symmetric in the biceps and patellae.  Cerebellar: Unable to assess secondary to patient's altered mental status.  Gait: Unable to assess secondary to patient's altered mental status.     I have reviewed labs in epic and the results pertinent to this consultation are: leukocytosis  I have reviewed the images obtained:CT head  2/16 - no acute findings  Impression: 53 yo M with AMS s/op cardiac arrest. Prelim EEG read with no seizure activity. I suspect that he has had a significant cerebral injury, but it is too early to say with any degree of certainty. I would favor observation until at least 72 hours post rewarming to further decide on likelihood of recovery.   Recommendations: 1) Will repeat CT head 2) Continue to follow.    Roland Rack, MD [facility] [phone]  If 7pm- 7am, please page neurology on call at [phone].

## 2013-07-18 NOTE — Procedures (Signed)
History: 53 yo M s/p CPR  Sedation: None  Technique: This is a 17 channel routine scalp EEG performed at the bedside with bipolar and monopolar montages arranged in accordance to the international 10/20 system of electrode placement. One channel was dedicated to EKG recording.    Background: The background consists of very low voltage irregular delta activity with an absence of faster frequencies. This is the case throughout the entire EEG.  Photic stimulation: Physiologic driving is not performed  EEG Abnormalities: 1) low-voltage generalized irregular delta  Clinical Interpretation: This EEG is consistent with a generalized non-specific cerebral dysfunction(encephalopathy). There was no seizure or seizure predisposition recorded on this study.   Roland Rack, MD [facility] [phone]  If 7pm- 7am, please page neurology on call at [phone].

## 2013-07-18 NOTE — Progress Notes (Signed)
EEG Completed; Results Pending  

## 2013-07-18 NOTE — Progress Notes (Signed)
Pt temp 103.1 notified MD. MD ordered to start cooling blanket and Tylenol PRN

## 2013-07-19 ENCOUNTER — Inpatient Hospital Stay (HOSPITAL_COMMUNITY): Payer: Medicaid Other

## 2013-07-19 LAB — BASIC METABOLIC PANEL
BUN: 7 mg/dL (ref 6–23)
CHLORIDE: 103 meq/L (ref 96–112)
CO2: 24 meq/L (ref 19–32)
Calcium: 7.1 mg/dL — ABNORMAL LOW (ref 8.4–10.5)
Creatinine, Ser: 0.69 mg/dL (ref 0.50–1.35)
GFR calc Af Amer: >90 mL/min (ref >90)
GFR calc non Af Amer: >90 mL/min (ref >90)
Glucose, Bld: 93 mg/dL (ref 70–99)
Potassium: 3.5 mEq/L — ABNORMAL LOW (ref 3.7–5.3)
Sodium: 138 mEq/L (ref 137–147)

## 2013-07-19 LAB — BLOOD GAS, ARTERIAL
Acid-base deficit: 0.8 mmol/L (ref 0.0–2.0)
Bicarbonate: 23.9 mEq/L (ref 20.0–24.0)
Drawn by: 31101
FIO2: 40 %
LHR: 10 {breaths}/min
O2 Saturation: 94.6 %
PATIENT TEMPERATURE: 96.8
PEEP: 5 cmH2O
PH ART: 7.378 (ref 7.350–7.450)
TCO2: 25.2 mmol/L (ref 0–100)
VT: 500 mL
pCO2 arterial: 40.9 mmHg (ref 35.0–45.0)
pO2, Arterial: 69.2 mmHg — ABNORMAL LOW (ref 80.0–100.0)

## 2013-07-19 LAB — CULTURE, RESPIRATORY W GRAM STAIN

## 2013-07-19 LAB — CBC
HEMATOCRIT: 29.3 % — AB (ref 39.0–52.0)
Hemoglobin: 9.6 g/dL — ABNORMAL LOW (ref 13.0–17.0)
MCH: 30.5 pg (ref 26.0–34.0)
MCHC: 32.8 g/dL (ref 30.0–36.0)
MCV: 93 fL (ref 78.0–100.0)
Platelets: 425 10*3/uL — ABNORMAL HIGH (ref 150–400)
RBC: 3.15 MIL/uL — AB (ref 4.22–5.81)
RDW: 15.2 % (ref 11.5–15.5)
WBC: 27.6 10*3/uL — ABNORMAL HIGH (ref 4.0–10.5)

## 2013-07-19 LAB — CULTURE, RESPIRATORY

## 2013-07-19 LAB — GLUCOSE, CAPILLARY: Glucose-Capillary: 100 mg/dL — ABNORMAL HIGH (ref 70–99)

## 2013-07-19 LAB — MAGNESIUM: Magnesium: 2.2 mg/dL (ref 1.5–2.5)

## 2013-07-19 LAB — PHOSPHORUS: Phosphorus: 2.9 mg/dL (ref 2.3–4.6)

## 2013-07-19 MED ORDER — MORPHINE BOLUS VIA INFUSION
5.0000 mg | INTRAVENOUS | Status: DC | PRN
  Filled 2013-07-19: qty 20

## 2013-07-19 MED ORDER — MORPHINE SULFATE 10 MG/ML IJ SOLN
10.0000 mg/h | INTRAVENOUS | Status: DC
  Administered 2013-07-19: 10 mg/h via INTRAVENOUS
  Filled 2013-07-19: qty 10

## 2013-07-19 MED ORDER — POTASSIUM CHLORIDE 10 MEQ/50ML IV SOLN
10.0000 meq | INTRAVENOUS | Status: AC
  Administered 2013-07-19 (×2): 10 meq via INTRAVENOUS
  Filled 2013-07-19 (×2): qty 50

## 2013-07-20 LAB — GLUCOSE, CAPILLARY: Glucose-Capillary: 92 mg/dL (ref 70–99)

## 2013-07-21 NOTE — Discharge Summary (Addendum)
NAMEBOSTON, COOKSON NO.:  [id number]  MEDICAL RECORD NO.:  [id number]  LOCATION:  7H21F                        FACILITY:  [facility]  PHYSICIAN:  Providence Lanius, MD  DATE OF BIRTH:  Oct 30, 1960  DATE OF ADMISSION:  07/23/2013 DATE OF DISCHARGE:  07/23/13                              DISCHARGE SUMMARY   DEATH SUMMARY  PRIMARY DIAGNOSIS/CAUSE OF DEATH:  Anoxic brain injury.  SECONDARY DIAGNOSES:  Pulseless electric activity cardiac arrest, respiratory failure, chronic obstructive pulmonary disease, asystole, esophageal cancer, aspiration pneumonia.  The patient is a 53 year old male with past medical significant for esophageal cancer, presented to the hospital from home after being found unresponsive.  Upon presentation to EMS, the patient was originally in PEA, then progressed into asystole.  ACLS protocol was followed.  Return of spontaneous circulation was established.  The patient was brought to the emergency department and then admitted in [facility]  The patient underwent the medically induced hypothermia protocol.  Upon completion, the patient showing evidence of myoclonus.  Neurology was involved.  An EEG and CT scan were performed, were consistent with anoxic injury.  The family was clearly informed of Neurology's recommendations, choose another 24 hours, and re-evaluation with EEG over the family felt that the patient has been in very poor health and suffering for sometime and wished that he had just become comfortable, at which point, morphine was started.  The patient was extubated and expired shortly thereafter with the family at bedside.     Providence Lanius, MD     WJY/MEDQ  D:  07/05/2013  T:  07/21/2013  Job:  [id number]

## 2013-07-22 LAB — CULTURE, BLOOD (ROUTINE X 2)
Culture: NO GROWTH
Culture: NO GROWTH

## 2013-07-29 NOTE — Progress Notes (Signed)
Subjective: No further myoclonus since yesterday, was on versed/fentanyl overnight.   Exam: Filed Vitals:   07/26/2013 0806  BP: 110/66  Pulse: 111  Temp:   Resp: 21   Gen: In bed, NAD MS: does not open eyes or follow commands RJ:JOACZ pupil very slightly larger than left, both reactive, corneals absent.  Motor: no movement to noxious stimuli.  Sensory: as above.    Impression: 53 yo M with liekly severe anoxic injury. I would favor an evaluation at 72 hours prior to making decisions regarding withdrawal of care. I have discussed with the family the likely devastating nature of his injury.   Recommendations: 1) Re-evaluate at 72 hours(tomorrow) 2) will continue to follow.   Roland Rack, MD [facility] [phone]  If 7pm- 7am, please page neurology on call at [phone].

## 2013-07-29 NOTE — Progress Notes (Signed)
[facility] ADULT ICU REPLACEMENT PROTOCOL FOR AM LAB REPLACEMENT ONLY  The patient does apply for the [facility] Adult ICU Electrolyte Replacment Protocol based on the criteria listed below:   1. Is GFR >/= 40 ml/min? yes  Patient's GFR today is >90 2. Is urine output >/= 0.5 ml/kg/hr for the last 6 hours? yes Patient's UOP is 1.43 ml/kg/hr 3. Is BUN < 60 mg/dL? yes  Patient's BUN today is 7 4. Abnormal electrolyte(s):Potassium 5. Ordered repletion with: Potassium per Protocol Brandon Aguirre P 2013-08-01 5:13 AM

## 2013-07-29 NOTE — Procedures (Signed)
Extubation Procedure Note  Patient Details:   Name: Clenton Esper DOB: 11-05-1960 MRN: [id number]   Airway Documentation:     Evaluation  O2 sats: stable throughout, pt extubated for withdrawal of life support Complications: No apparent complications Patient did tolerate procedure well. Bilateral Breath Sounds: Diminished;Other (Comment) (coarse) Suctioning: Airway No, pt extubated for withdrawal of life support, pt appears comfortable. RN at bedside.  Lenna Sciara 07/07/2013, 12:46 PM

## 2013-07-29 NOTE — Progress Notes (Addendum)
Pt's death pronounced at 41; no heart tone auscultated for 2 full minutes; witnessed by 2 RNs;  P. Harleen Fineberg RN

## 2013-07-29 NOTE — Progress Notes (Addendum)
200cc of fentanyl, 80cc of morphine & 30cc of versed wasted in sink witnessed by 2 RNs P. Montrice Montuori RN

## 2013-07-29 NOTE — Progress Notes (Signed)
Comfort care measures in place; morphine gtt started at 11am; emotional support given to pt & to family

## 2013-07-29 NOTE — Progress Notes (Signed)
Pulmonary/Critical Care Progress Note   Name: Brandon Aguirre MRN: [id number] DOB: 1960-11-03    ADMISSION DATE:  July 29, 2013   REFERRING MD :  EDP PRIMARY SERVICE:PCCM  CHIEF COMPLAINT: PEA arrest  BRIEF PATIENT DESCRIPTION:   53 yo WM with little known PMH , presents with post arrests, down time 15 minutes, CPR x 5 minutes , 1 epi with return of pulse. Transported via [facility] to [facility] ED and king airway changed to OTT. 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 No other information or family available.  STaff MD note: Per EMS RN and Dr Preston Fleeting info obtained later who got infrom sister in law: Discharged from [place] on 07/13/13 following throat cancer workup. Post dc not doing well and not eating and having dyspnea. Family found him in bathroom. Asystole on EMS arrival. And epi x 1 with ROSC. Then enroute PEA with epi x 2 and arrived on dopamine  SIGNIFICANT EVENTS / STUDIES:  2/16 hypothermia>>  LINES / TUBES: 2/16 ott>> 2/16 Lt I J cvl>>  CULTURES: 2/16 bc x 2>> 2/16 uc>> 2/16 sputum>>  ANTIBIOTICS: 2/16 vanc>> 2/16 zoysn>>  SUBJECTIVE: patient with myoclonus immediately post warming.  Now sedated.  VITAL SIGNS: Temp:  [97 F (36.1 C)-100.6 F (38.1 C)] 97 F (36.1 C) (02/19 0747) Pulse Rate:  [102-121] 105 (02/19 1100) Resp:  [11-23] 14 (02/19 1100) BP: (75-166)/(49-89) 129/73 mmHg (02/19 1100) SpO2:  [91 %-100 %] 99 % (02/19 1100) FiO2 (%):  [40 %] 40 % (02/19 0830) Weight:  [54.5 kg (120 lb 2.4 oz)] 54.5 kg (120 lb 2.4 oz) (02/19 0500)  HEMODYNAMICS:    VENTILATOR SETTINGS: Vent Mode:  [-] PRVC FiO2 (%):  [40 %] 40 % Set Rate:  [10 bmp] 10 bmp Vt Set:  [500 mL] 500 mL PEEP:  [5 cmH20] 5 cmH20 Plateau Pressure:  [16 cmH20-19 cmH20] 18 cmH20  INTAKE / OUTPUT: Intake/Output     02/18 0701 - 02/19 0700 02/19 0701 - 02/20 0700   I.V. (mL/kg) 1970.3 (36.2) 136.4 (2.5)   IV Piggyback 1270    Total Intake(mL/kg) 3240.3 (59.5) 136.4 (2.5)    Urine (mL/kg/hr) 2275 (1.7)    Total Output 2275 0   Net +965.3 +136.4         PHYSICAL EXAMINATION: General: Thin wm on vent, chronically ill appearing. Neuro: sedated on vent, myoclonus, not purposeful. HEENT: no jvd/lan Cardiovascular:  HSR RRR Lungs:  CTA. Abdomen:  Flat, G tube in place Musculoskeletal:  Intact Skin:  warm  LABS: PULMONARY  Recent Labs Lab 2013-07-29 0641  2013-07-29 2050  07/17/13 0314 07/17/13 0517 07/17/13 1421 07/18/13 0320 07/07/2013 0500  PHART 7.480*  --  7.403  --   --  7.444  --  7.430 7.378  PCO2ART 35.4  --  38.6  --   --  33.7*  --  36.6 40.9  PO2ART 261.0*  --  91.3  --   --  73.0*  --  76.4* 69.2*  HCO3 26.3*  --  24.9*  --   --  23.9  --  23.4 23.9  TCO2 27  < > 26.4  < > 23 25.2 23 24.5 25.2  O2SAT 100.0  --  98.1  --   --  97.2  --  94.8 94.6  < > = values in this interval not displayed.  CBC  Recent Labs Lab 07/17/13 0500 07/17/13 1421 07/18/13 0500 07/17/2013 0355  HGB 10.7* 11.2* 9.6* 9.6*  HCT 32.0* 33.0*  28.9* 29.3*  WBC 36.1*  --  30.7* 27.6*  PLT 459*  --  456* 425*    COAGULATION  Recent Labs Lab 07/11/2013 0600 07/11/2013 0741  INR 1.26 1.34   CARDIAC  Recent Labs Lab 07/18/2013 0600 07/27/2013 1100 07/07/2013 1845 07/13/2013 2327  TROPONINI 0.53* 2.42* 2.60* 2.39*   No results found for this basename: PROBNP,  in the last 168 hours  CHEMISTRY  Recent Labs Lab 07/09/2013 0600 07/23/2013 0741  07/17/13 0314 07/17/13 0500 07/17/13 1421 07/18/13 0500 07-28-13 0355  NA 141 143  < > 145 143 145 140 138  K 3.0* 2.9*  < > 3.8 4.3 3.8 3.6* 3.5*  CL 99 107  < > 106 109 106 106 103  CO2 23 25  --   --  20  --  24 24  GLUCOSE 298* 247*  < > 157* 140* 127* 89 93  BUN 19 19  < > 11 12 11 12 7   CREATININE 0.80 0.69  < > 0.50 0.49* 0.70 0.97 0.69  CALCIUM 9.6 8.6  --   --  7.8*  --  7.3* 7.1*  MG  --  2.3  --   --  2.1  --  1.8 2.2  PHOS  --  3.0  --   --  2.4  --  1.8* 2.9  < > = values in this interval not  displayed. Estimated Creatinine Clearance: 81.4 ml/min (by C-G formula based on Cr of 0.69).  LIVER  Recent Labs Lab 07/24/2013 0600 07/28/2013 0741  AST 58* 63*  ALT 60* 57*  ALKPHOS 102 82  BILITOT 0.2* 0.3  PROT 7.1 6.2  ALBUMIN 2.3* 2.1*  INR 1.26 1.34   INFECTIOUS  Recent Labs Lab 07/07/2013 0604 07/04/2013 0741 07/06/2013 1100 07/17/13 0500 07/18/13 0500  LATICACIDVEN 7.36* 2.6* 1.5  --   --   PROCALCITON  --  <0.10  --  1.90 1.77   ENDOCRINE CBG (last 3)   Recent Labs  07/18/13 1938 07/18/13 2325 07/28/13 0342  GLUCAP 112* 109* 100*   IMAGING x48h  Ct Head Wo Contrast  07/18/2013   CLINICAL DATA:  Anoxic encephalopathy? Cardiac arrest with altered mental status, possible seizure. Follow-up.  EXAM: CT HEAD WITHOUT CONTRAST  TECHNIQUE: Contiguous axial images were obtained from the base of the skull through the vertex without contrast.  COMPARISON:  None  FINDINGS: Gray-white junction is preserved throughout. No evidence for brain swelling, focal cerebral infarction, or intracranial hemorrhage. Calvarium intact. Pan sinus fluid. Mild dependent mastoid fluid. Mild premature for age atrophy. There is no appreciable change in the appearance of the brain compared with 07/18/2013.  IMPRESSION: No CT signs of anoxic encephalopathy are evident. Continued surveillance is warranted.   Electronically Signed   By: Rolla Flatten M.D.   On: 07/18/2013 17:29   Dg Chest Port 1 View  07-28-13   CLINICAL DATA:  Ventilator.  Shortness of breath.  EXAM: PORTABLE CHEST - 1 VIEW  COMPARISON:  07/18/2013  FINDINGS: Support devices are unchanged. Heart is normal size. Bilateral lower lobe airspace opacities and layering effusions. No real change since prior study. No acute bony abnormality.  IMPRESSION: Bibasilar opacities and small layering bilateral pleural effusions.   Electronically Signed   By: Rolm Baptise M.D.   On: July 28, 2013 06:22   Dg Chest Port 1 View  07/18/2013   CLINICAL DATA:   Assess ET tube position.  EXAM: PORTABLE CHEST - 1 VIEW  COMPARISON:  07/25/2013  FINDINGS: Endotracheal tube is in stable position as is the left central line. New bilateral lower lobe airspace opacities. Heart is normal size. No visible effusions. No acute bony abnormality.  IMPRESSION: New bilateral lower lobe airspace opacities.   Electronically Signed   By: Rolm Baptise M.D.   On: 07/18/2013 05:09   ASSESSMENT / PLAN:  PULMONARY A: VDRF secondary to Arrest.  ? Hx of COPD P:   - Terminal extubation today. - Full comfort care.  CARDIOVASCULAR A: Asytole P:  - D/C pressors.  RENAL A:  Hypokalemia P:   - D/C further blood draws.  GASTROINTESTINAL A:  G tube in place. Hx  Of Esophogeal cancer  Per scantry reports. CT neck 06/29/13 at [facility] showing Head and NEck cancer P:   - D/C TF.  HEMATOLOGIC A:  No acute issues P:  - No further blood draws.  INFECTIOUS A:  Empiric abx given high risk for aspiration and presence of COPD in winter season P:   - D/C abx.  ENDOCRINE A:  Unknown hx of DM. P:   - D/C CBG and SSI.  NEUROLOGIC A:  AMS ? Seizure Neck CT with diffuse extension of tumor to include prevertebral and left ICA invasion, progressive. P:   - No further interventions.  ONCOLOGY A: Esophageal cancer. CT neck 06/29/13 with cancer in throat  P - Comfort measures.   [facility] wishes to wait another 24 hours but agrees that prognosis is very poor.  I relayed that to the family but they were very clear that patient would not want this level of interventions.  He was dying from cancer and has always wished to only be comfortable.  Given that, I see little reason to wait til AM as even if survives this he will be dying from his cancer.  Will start morphine drip and terminally extubate per family's wishes on the patient's behalf.  CC time 35 min.  Rush Farmer, M.D. [facility] Pulmonary/Critical Care Medicine. Pager: [phone]. After hours pager:  [phone].

## 2013-07-29 DEATH — deceased

## 2014-09-15 IMAGING — CT CT NECK W/ CM
3 of 7 series · 11 of 33 positions shown, 13 images · IV contrast (CONTRAST)
Comparison: 01/07/[address] head CT.

06/29/2013 neck CT

CLINICAL DATA: Cardiac arrest. Rule out intracranial hemorrhage.
Anoxia.

EXAM:
CT HEAD WITHOUT AND WITH CONTRAST
CT NECK WITH CONTRAST
TECHNIQUE: Contiguous axial images were obtained from the base of the skull
through the vertex without and with intravenous contrast
Multidetector CT imaging of the and neck was performed using the
standard protocol following the bolus administration of intravenous
contrast.
CONTRAST:  75mL OMNIPAQUE IOHEXOL 300 MG/ML  SOLN

[Series 6: soft tissue · axial · 0.44mm/px · z∈[+102,+226]mm · 3 of 124 slices shown, 4 images]
[im 31/124  soft-tissue]
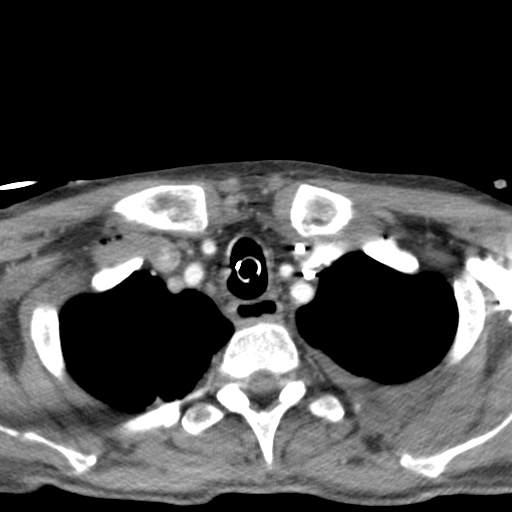
[im 31/124  bone]
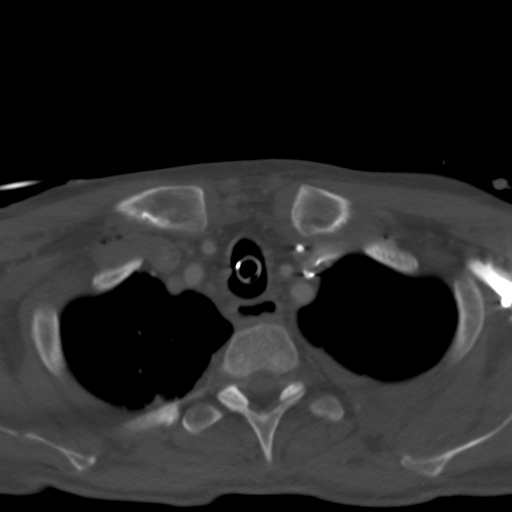
[im 62/124  bone]
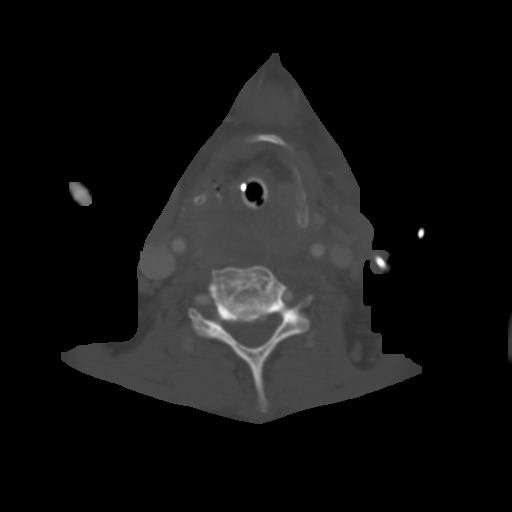
[im 93/124  bone]
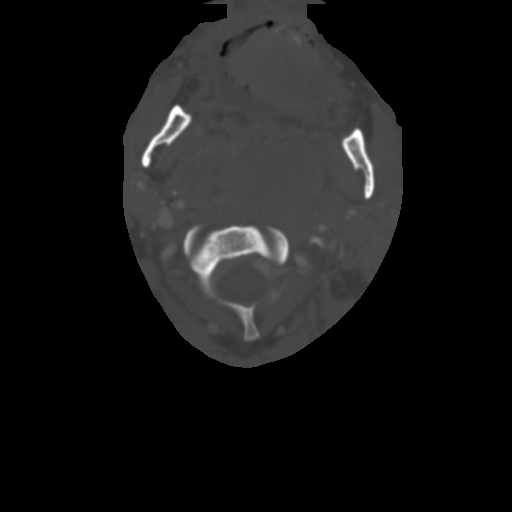

[mpr, sagittal, sagittal · sagittal · 0.48mm/px · 5 of 111 slices shown, 6 images]
[im 37/111  bone]
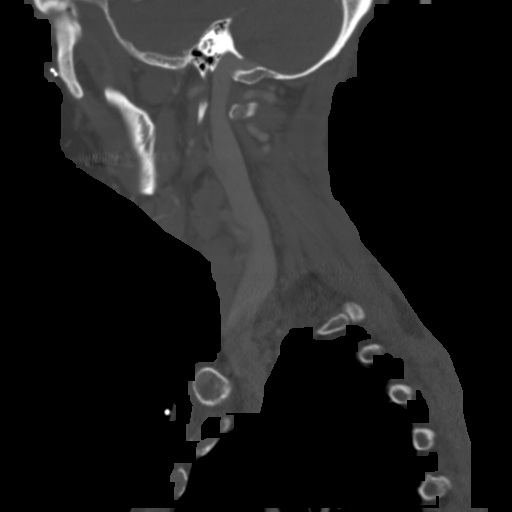
[im 46/111  bone]
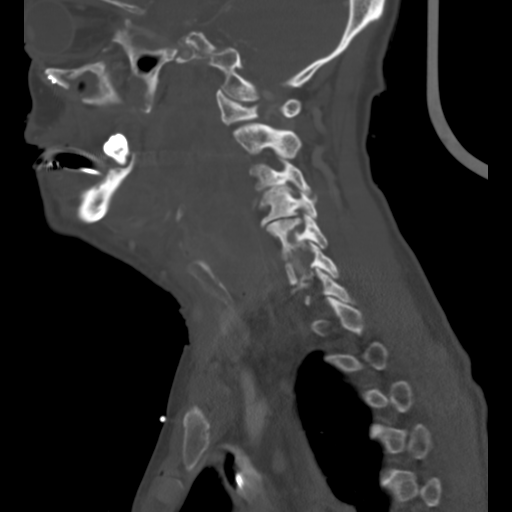
[im 56/111  soft-tissue]
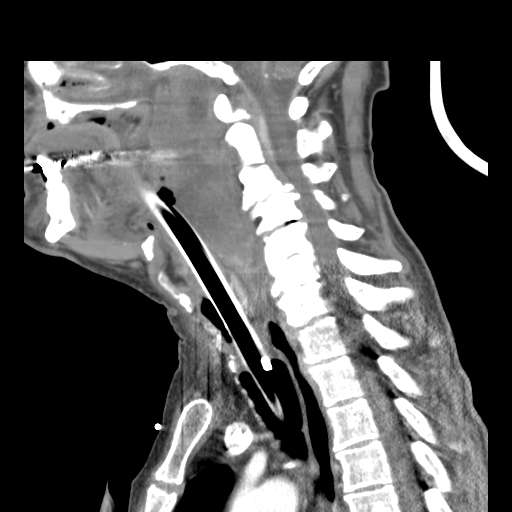
[im 56/111  bone]
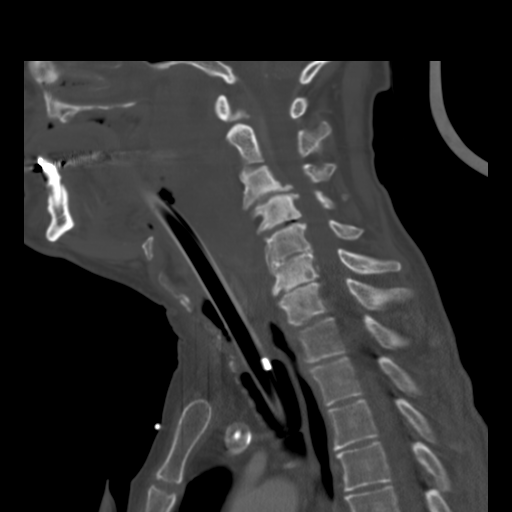
[im 65/111  bone]
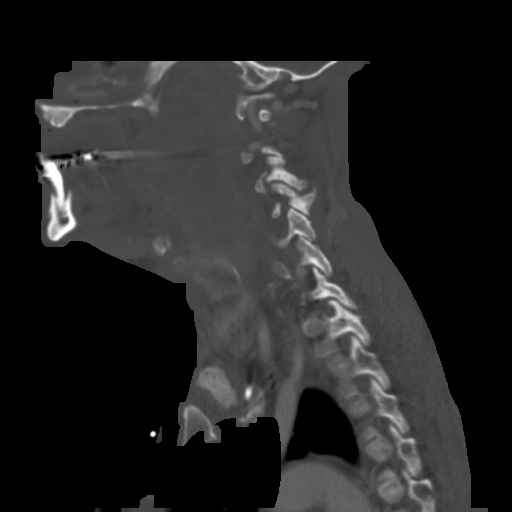
[im 74/111  bone]
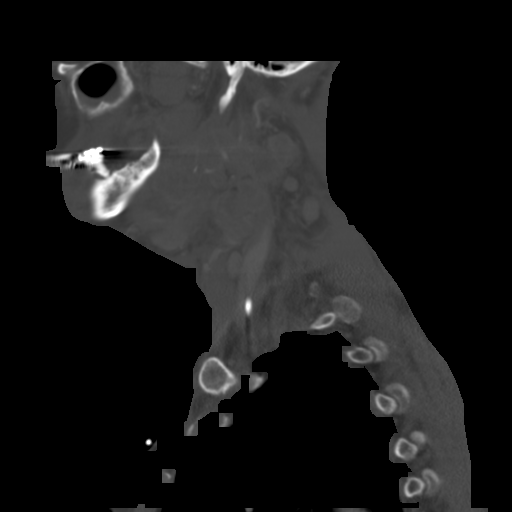

[mpr, coronal, coronal · coronal · 0.48mm/px · 3 of 93 slices shown]
[im 24/93  bone]
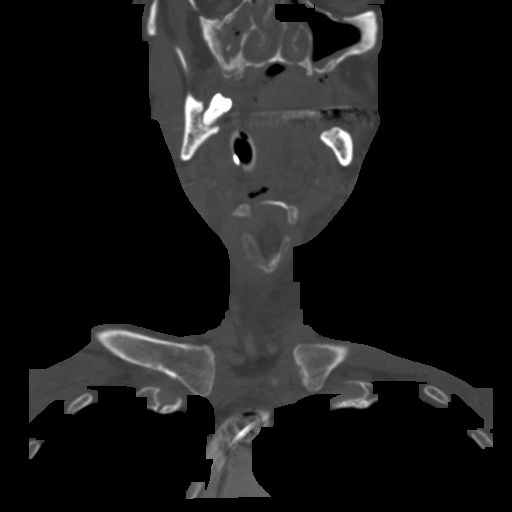
[im 38/93  bone]
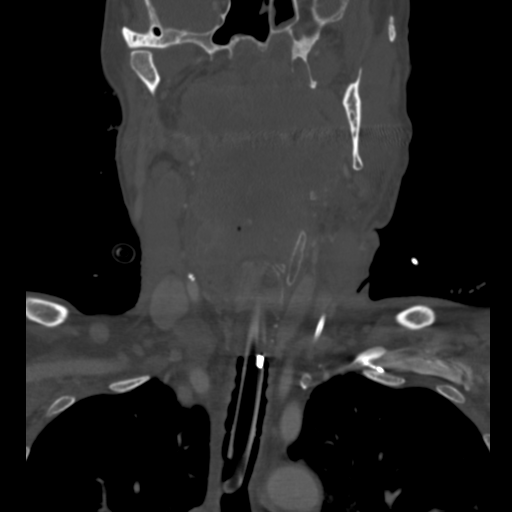
[im 52/93  bone]
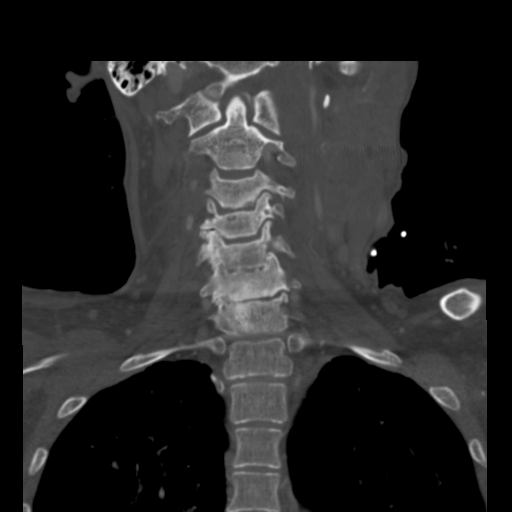

[11 of 33 positions shown; findings below may reference images not displayed]

FINDINGS: CT HEAD FINDINGS

Skull and Sinuses:Diffuse inflammatory mucosal thickening. Remote
right mid face fracture status post open reduction internal
fixation.

Orbits: No acute abnormality.

Brain: No evidence of acute abnormality, such as acute infarction,
hemorrhage, hydrocephalus, or mass lesion/mass effect. No abnormal
intracranial enhancement. Punctate density in the subarachnoid space
of the high and posterior left frontal lobe was likely present [id number],
most likely calcification.

CT NECK FINDINGS

Bilateral anterior first and second rib fractures, nondisplaced.
There is no evidence of pneumothorax. Centrilobular emphysema.
Bilateral ground-glass opacities in the posterior upper lobes, favor
aspiration. Small left pleural effusion which appears water density.

Large, heterogeneously enhancing mucosal based mass in the
circumferential upper aerodigestive tract, extending from the
nasopharynx -which is completely obliterated -to the level of the
hypopharynx and supraglottic larynx. The mass invades the bilateral
prevertebral space at the level of the nasopharynx. Tumor extends to
the left masticator space, without definite invasion. The volume of
mass is markedly increased from recent imaging, which may reflect
progression and superimposed treatment related edema or edema
related to recent intubation. There is bilateral enlarged and
necrotic lymph nodes in stations 2, 3, and 4. The largest nodal
conglomerate is in the left level 2 station, measuring 3.4 x 2.7 cm
(previously 2.4 x 1.4 cm). The left mid internal jugular vein is
completely effaced, without thrombosis in the proximal or distal
segments. The distal left internal carotid artery is flattened by
tumor, at the level of the styloid process, were there is at least
270 degrees of tumor. Parapharyngeal and retropharyngeal edema
without evidence of acute collection.

An endotracheal tube successfully traverses the large supraglottic
mass, and ends in the upper thoracic trachea.
IMPRESSION: 1. Necrotic tumor extending from the nasopharynx to the hypopharynx,
with bilateral metastatic adenopathy at stations 2 through 4. The
malignancy is diffusely progressive since 06/29/2013, consistent
with disease progression -possibly with superimposed treatment
related change. Tumor extent is further described above, including
prevertebral invasion and left ICA involvement.
2. Endotracheal tube in good position.
3. Bilateral anterior second and third rib fractures after CPR.
4. Probable biapical aspiration.
5. No acute intracranial abnormality.

## 2014-09-18 IMAGING — CR DG CHEST 1V PORT
1 series · 1 of 1 positions shown · non-contrast
Comparison: 07/18/2013

CLINICAL DATA: Ventilator.  Shortness of breath.

EXAM:
PORTABLE CHEST - 1 VIEW

[AP]
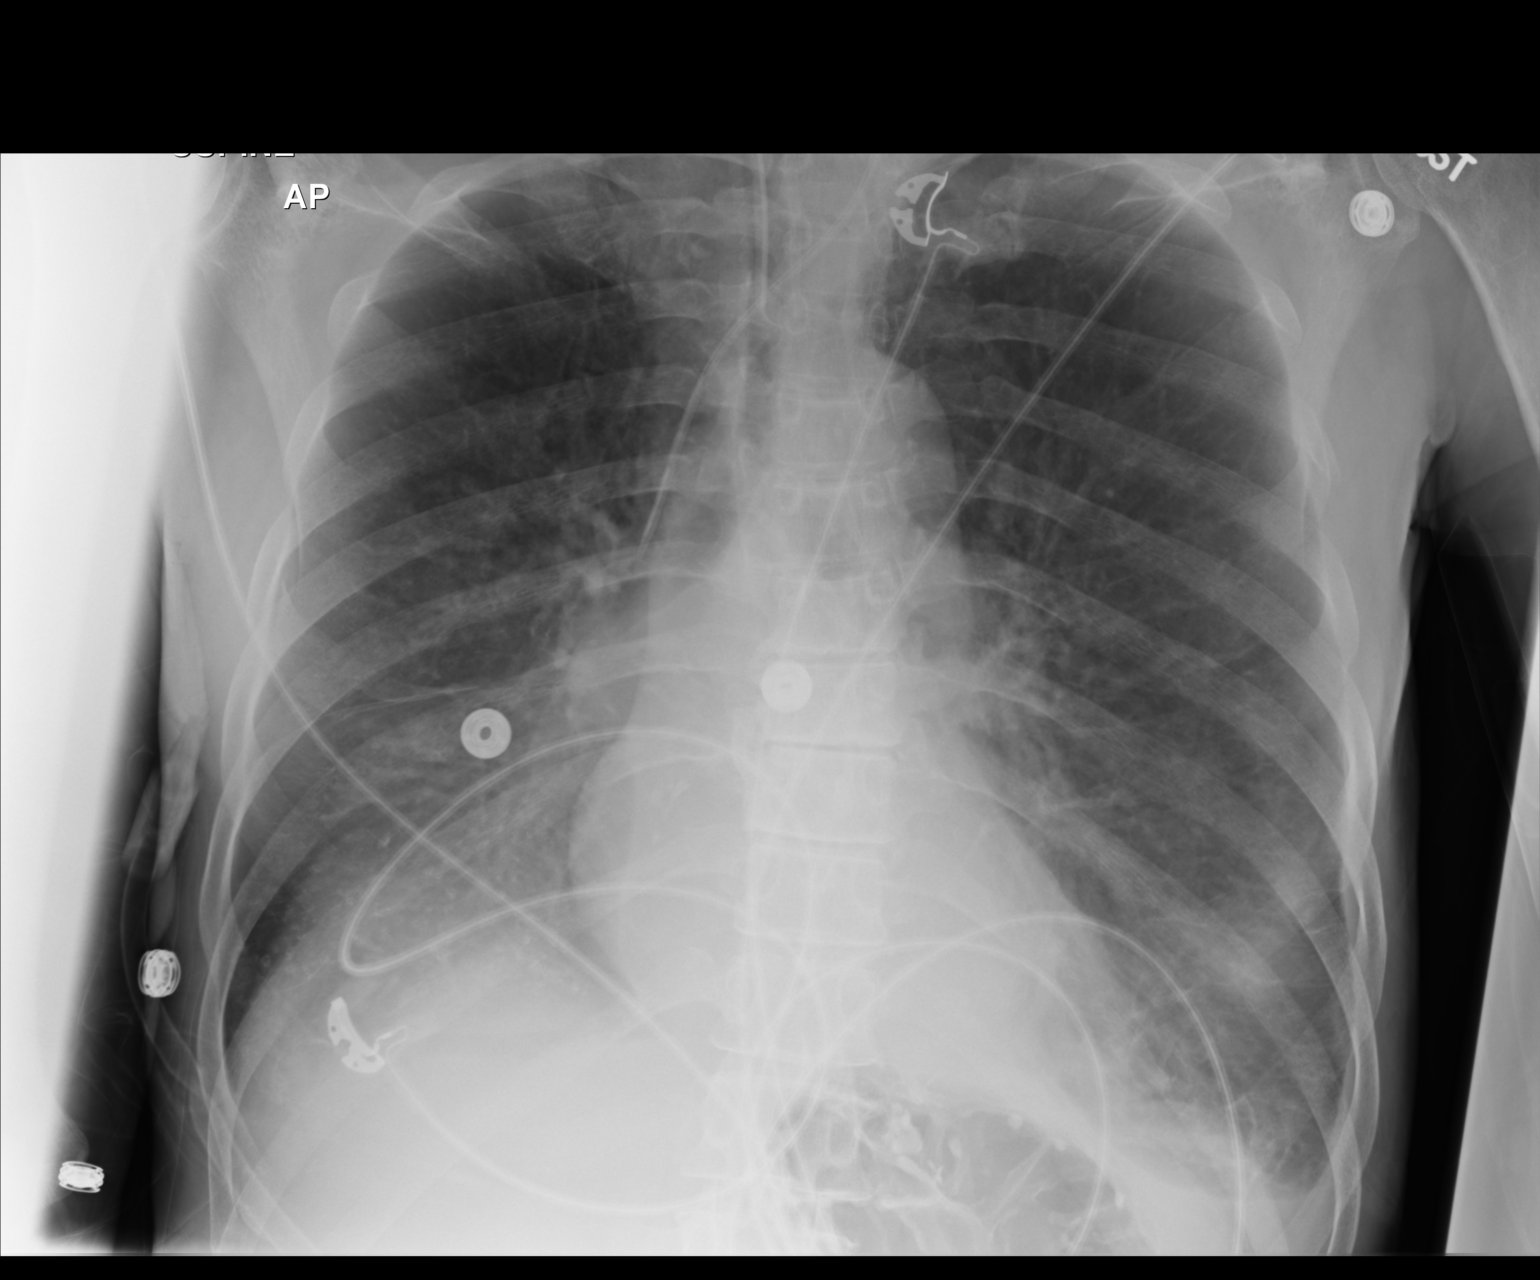

[1 of 1 positions shown; findings below may reference images not displayed]

FINDINGS: Support devices are unchanged. Heart is normal size. Bilateral lower
lobe airspace opacities and layering effusions. No real change since
prior study. No acute bony abnormality.
IMPRESSION: Bibasilar opacities and small layering bilateral pleural effusions.
